# Patient Record
Sex: Female | Born: 1935 | Race: White | Hispanic: No | Smoking: Never smoker
Health system: Southern US, Community
[De-identification: ages and names within clinical notes are randomized; demographics above are authoritative.]

## PROBLEM LIST (undated history)

## (undated) DIAGNOSIS — F0280 Dementia in other diseases classified elsewhere without behavioral disturbance: Secondary | ICD-10-CM

## (undated) DIAGNOSIS — I10 Essential (primary) hypertension: Secondary | ICD-10-CM

## (undated) DIAGNOSIS — S7290XA Unspecified fracture of unspecified femur, initial encounter for closed fracture: Secondary | ICD-10-CM

## (undated) DIAGNOSIS — S0292XA Unspecified fracture of facial bones, initial encounter for closed fracture: Secondary | ICD-10-CM

## (undated) DIAGNOSIS — G309 Alzheimer's disease, unspecified: Secondary | ICD-10-CM

## (undated) DIAGNOSIS — J309 Allergic rhinitis, unspecified: Secondary | ICD-10-CM

## (undated) DIAGNOSIS — M810 Age-related osteoporosis without current pathological fracture: Secondary | ICD-10-CM

---

## 2001-07-21 ENCOUNTER — Emergency Department (HOSPITAL_COMMUNITY): Admission: EM | Admit: 2001-07-21 | Discharge: 2001-07-21 | Payer: Self-pay | Admitting: Emergency Medicine

## 2001-07-21 ENCOUNTER — Encounter: Payer: Self-pay | Admitting: Emergency Medicine

## 2001-07-22 ENCOUNTER — Encounter: Payer: Self-pay | Admitting: General Surgery

## 2001-07-22 ENCOUNTER — Inpatient Hospital Stay (HOSPITAL_COMMUNITY): Admission: EM | Admit: 2001-07-22 | Discharge: 2001-08-08 | Payer: Self-pay | Admitting: General Surgery

## 2008-10-03 ENCOUNTER — Inpatient Hospital Stay (HOSPITAL_COMMUNITY): Admission: EM | Admit: 2008-10-03 | Discharge: 2008-10-05 | Payer: Self-pay | Admitting: Emergency Medicine

## 2010-01-30 ENCOUNTER — Emergency Department (HOSPITAL_COMMUNITY): Admission: EM | Admit: 2010-01-30 | Discharge: 2010-01-31 | Payer: Self-pay | Admitting: Emergency Medicine

## 2010-10-28 ENCOUNTER — Other Ambulatory Visit: Payer: Self-pay | Admitting: Internal Medicine

## 2010-11-05 ENCOUNTER — Ambulatory Visit
Admission: RE | Admit: 2010-11-05 | Discharge: 2010-11-05 | Disposition: A | Discharge status: Home / Self Care | Payer: Medicaid Other | Source: Ambulatory Visit | Attending: Internal Medicine | Admitting: Internal Medicine

## 2010-12-29 LAB — CBC
HCT: 33.6 % — ABNORMAL LOW (ref 36.0–46.0)
Hemoglobin: 9.7 g/dL — ABNORMAL LOW (ref 12.0–15.0)
MCV: 100.2 fL — ABNORMAL HIGH (ref 78.0–100.0)
MCV: 99.9 fL (ref 78.0–100.0)
Platelets: 128 10*3/uL — ABNORMAL LOW (ref 150–400)
RBC: 2.94 MIL/uL — ABNORMAL LOW (ref 3.87–5.11)
RBC: 3.36 MIL/uL — ABNORMAL LOW (ref 3.87–5.11)
RDW: 12.6 % (ref 11.5–15.5)
RDW: 12.9 % (ref 11.5–15.5)
RDW: 15.1 % (ref 11.5–15.5)
WBC: 10.2 10*3/uL (ref 4.0–10.5)

## 2010-12-29 LAB — BASIC METABOLIC PANEL
BUN: 33 mg/dL — ABNORMAL HIGH (ref 6–23)
CO2: 23 mEq/L (ref 19–32)
CO2: 26 mEq/L (ref 19–32)
Calcium: 8.3 mg/dL — ABNORMAL LOW (ref 8.4–10.5)
Calcium: 9.2 mg/dL (ref 8.4–10.5)
Chloride: 104 mEq/L (ref 96–112)
Creatinine, Ser: 0.78 mg/dL (ref 0.4–1.2)
Creatinine, Ser: 0.96 mg/dL (ref 0.4–1.2)
GFR calc Af Amer: >60 mL/min (ref >60)
GFR calc Af Amer: >60 mL/min (ref >60)
Glucose, Bld: 134 mg/dL — ABNORMAL HIGH (ref 70–99)
Potassium: 3.8 mEq/L (ref 3.5–5.1)
Potassium: 4 mEq/L (ref 3.5–5.1)
Sodium: 133 mEq/L — ABNORMAL LOW (ref 135–145)

## 2010-12-29 LAB — URINALYSIS, ROUTINE W REFLEX MICROSCOPIC
Glucose, UA: NEGATIVE mg/dL
Hgb urine dipstick: NEGATIVE
Protein, ur: NEGATIVE mg/dL
Specific Gravity, Urine: 1.029 (ref 1.005–1.030)
Urobilinogen, UA: 0.2 mg/dL (ref 0.0–1.0)
pH: 6 (ref 5.0–8.0)

## 2010-12-29 LAB — DIFFERENTIAL
Basophils Relative: 0 % (ref 0–1)
Monocytes Absolute: 0.2 10*3/uL (ref 0.1–1.0)
Neutrophils Relative %: 87 % — ABNORMAL HIGH (ref 43–77)

## 2010-12-29 LAB — PROTIME-INR
INR: 1.1 (ref 0.00–1.49)
Prothrombin Time: 14.1 seconds (ref 11.6–15.2)
Prothrombin Time: 15.1 seconds (ref 11.6–15.2)
Prothrombin Time: 16.3 seconds — ABNORMAL HIGH (ref 11.6–15.2)

## 2010-12-29 LAB — URINE MICROSCOPIC-ADD ON

## 2010-12-29 LAB — CROSSMATCH: ABO/RH(D): A POS

## 2010-12-29 LAB — POCT CARDIAC MARKERS

## 2010-12-29 LAB — ABO/RH: ABO/RH(D): A POS

## 2011-01-27 NOTE — Discharge Summary (Signed)
Tiffany Mckinney, Tiffany Mckinney                ACCOUNT NO.:  [id number]   MEDICAL RECORD NO.:  [id number]          PATIENT TYPE:  INP   LOCATION:  5015                         FACILITY:  [facility]   PHYSICIAN:  Mark C. Ophelia Charter, M.D.    DATE OF BIRTH:  1922/09/17   DATE OF ADMISSION:  10/03/2008  DATE OF DISCHARGE:  10/05/2008                               DISCHARGE SUMMARY   ADMISSION DIAGNOSES:  1. Left intertrochanteric hip fracture.  2. Hypertension  3. Gastroesophageal reflux disease.  4. Dementia.  5. Gait disorder.  6. Osteoporosis.  7. Anemia.  8. Cataracts.   DISCHARGE DIAGNOSES:  1. Left intertrochanteric hip fracture.  2. Hypertension  3. Gastroesophageal reflux disease.  4. Dementia.  5. Gait disorder.  6. Osteoporosis.  7. Anemia.  8. Cataracts.  9. Posthemorrhagic anemia requiring blood transfusion.  10.Conjunctivitis.   PROCEDURE:  On October 03, 2008, the patient underwent intermedullary  nailing of the left intertrochanteric hip fracture utilizing a Synthesis  HIS nail.  This was performed by Dr. Ophelia Charter under general anesthesia.   CONSULTATIONS:  None.   BRIEF HISTORY:  Tiffany Mckinney is an 77 year old female resident of  [facility] nursing facility.  She was brought to the emergency  room on October 03, 2008 after she fell to the floor injuring her left  hip.  She reports that she does not know why she fell.  She denies loss  of consciousness, head trauma or dizziness.  She was unable to get up as  she could not bear weight on the left lower extremity.  In the emergency  department she was noted to have internal rotation of the left lower  extremity.  Radiographs showed a displaced left intertrochanteric hip  fracture.  It was felt that she would require surgical intervention and  was admitted for the procedure as stated above.   BRIEF HOSPITAL COURSE:  The patient tolerated the procedure under  general anesthesia without complications.  Postoperatively,  neurovascular and motor function of the lower extremities was noted to  be intact.  The patient was placed on Coumadin for DVT prophylaxis and  adjustments in Coumadin dose made according to protimes by the  pharmacist.  The patient had some mild bloody drainage from the incision  initially .  Dressing change was performed.  Incision was dry and clean  on postop day #2.  The patient had elevated temperature postoperatively  and on postop day #2 this was normalized.  She had a Foley catheter  which was discontinued with urinalysis negative for urinary tract  infection.  The patient was able to void without difficulty.  Postoperative hemoglobin and hematocrit dropped to 7.9 and 23.5  respectively.  After 1 unit of packed red blood cells, values returned  to 9.7 and 28.5.  The patient was noted to have purulent matter and  drainage from the eyes.  She was treated with eye drops and warm  compresses for conjunctivitis.  The patient was seen by the physical  therapist.  She was allowed weightbearing as tolerated on the operative  extremity.  The patient was able  to ambulate 5 feet utilizing a walker  with the assistance of the physical therapist.  Occupational Therapy  evaluation was performed.  The patient will require further physical  therapy and occupational therapy for return to her baseline activity  level.  As she was stable on postop day #2, it was felt she could return  to the nursing facility where this would be provided.   PERTINENT LABORATORY VALUES:  On admission:  Chemistry studies with  glucose 129, BUN 33, otherwise values within normal limits.  Repeat BMET  on the day of discharge with sodium of 133, BUN 16, creatinine 0.63,  glucose 142.  On the day of discharge, INR 1.2.   PLAN:  1. The patient will return to [facility].      There she should continue to receive physical therapy for      ambulation and gait training.  She should utilize a walker  for      ambulation and have assistance.  She will be weightbearing as      tolerated on the left lower extremity.  2. For the left hip wound, there are staples present.  These will be      removed at her 2-week office visit.  3. Dressing change can be done daily or as needed.  Ice packs to be      utilized to the left thigh as needed.  She may have range of motion      of the left knee as tolerated.  No hip precautions necessary.  She      should undergo strict skin care to prevent decubitus ulcers.  4.      The patient will remain on Coumadin for deep vein thrombosis      prophylaxis.  4. She should continue on a regular diet.  5. The patient is to follow up with Dr. Ophelia Charter 2 weeks from surgery      with this appointment made by calling [phone].   MEDICATIONS AT DISCHARGE:  1. Tricor 48 mg daily.  2. Os-Cal D p.o. t.i.d.  3. Multivitamin daily.  4. Lisinopril 40 mg daily.  5. Lexapro 10 mg daily.  6. Protonix 40 mg q.12 hours.  7. Namenda 10 mg daily.  8. Metoprolol 50 mg b.i.d.  9. Aricept 10 mg q.h.s.  10.Colace one p.o. b.i.d.  11.Claritin D daily as needed.  12.Zymar 0.3% ophthalmic solution to each eye 3 times daily which was      started on October 04, 2008.  13.Laxative and enema of choice.  14.The patient has been utilizing Percocet 1 to 2 every 4-6 hours as      needed for pain.  15.Robaxin 500 mg one every 8 hours as needed for spasm.  She may      continue these at the nursing facility or different medications      utilized for pain as deemed necessary by the staff physician at the      nursing facility.  Also, her Coumadin should be managed by the      staff at the nursing facility.   If there are questions or concerns regarding her orthopedic status prior  to return office visit, please notify Dr. Ophelia Charter' office by calling the  above stated number.  All questions encouraged and answered at the time  of discharge.  The  patient verbalizes understanding of  instructions.  However, she does  have significant dementia.  She is oriented only to person at the  time  of discharge.   CONDITION ON DISCHARGE:  Stable.      Wende Neighbors, P.A.      Mark C. Ophelia Charter, M.D.  Electronically Signed    SMV/MEDQ  D:  10/05/2008  T:  10/05/2008  Job:  [id number]   cc:   Veverly Fells. Ophelia Charter, M.D.

## 2011-01-27 NOTE — Op Note (Signed)
Tiffany Mckinney, Tiffany Mckinney                ACCOUNT NO.:  [id number]   MEDICAL RECORD NO.:  [id number]          PATIENT TYPE:  INP   LOCATION:  5015                         FACILITY:  [facility]   PHYSICIAN:  Mark C. Ophelia Charter, M.D.    DATE OF BIRTH:  03-29-1923   DATE OF PROCEDURE:  10/03/2008  DATE OF DISCHARGE:                               OPERATIVE REPORT   PREOPERATIVE DIAGNOSIS:  Left three-part intertrochanteric hip fracture.   POSTOPERATIVE DIAGNOSIS:  Left three-part intertrochanteric hip  fracture.   PROCEDURE:  Left intramedullary hip screw Synthes, 100-mm lag screw, 360  length nail.   ESTIMATED BLOOD LOSS:  Minimal.   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  She slipped, lost  her footing, fell, and injured her left hip with intertrochanteric  fracture.  She has had a past history of wrist fracture and cervical  fracture from previous falls.   PROCEDURE:  After induction of general anesthesia, attempts were made to  contact numerous guardianship social workers in clinic, Kandyce Rud,  [employer], and [employer]  These include office numbers and also  pagers all were unsuccessful.  She was taken from the emergency room to  the OR with broken hip stabilization and this ambulator was appropriate  and Dr. Beverely Low, another board certified orthopedic surgeon in  another group, also signed the consent and a brief note in the progress  note stating that the surgery was necessary and indicated for  stabilization of her unstable hip fracture.   After induction of general anesthesia, the patient was placed in the  fracture table, well-leg holder.  Foley catheter had been inserted,  urine was clear, foot was carefully padded, secured, traction applied,  internal rotation and C-arm used for checking reduction.  There was  excellent positional alignment, AP and lateral.  Hip was prepped from  the  iliac crest down past the knee.  Area was squared with towels and a  large Betadine Vi-Drape was applied that was the shower, curtain size.  After surgical time-out was completed, Ancef was given prophylactically,  incision was made laterally above the trochanter.  Gluteus medius fascia  was split.  Using fluoroscopic visualization, the guidewire was drilled  down below the lesser trochanteric region, overreamed and then need a  selected rod based on preoperative fluoroscopic measurements showing a  360 length nail was appropriate.  Nail was inserted and packed in an  appropriate position, guide attached, small incision was made in the  subtrochanteric region.  Pin was drilled up, initially was a little bit  high, it was backed out.  The nail was inserted a little bit further and  then as this pin was reamed up, it was centered on AP and centered on  lateral.  Overreaming of the cortex followed by insertion of the lag  screw, locking it down proximally and then removing the aiming device.  After irrigation of saline solution, mild fluoroscopy pictures were  taken confirming excellent position; one picture taken showing the  rod  at the knee.  There was good tight fit down through the canal and no  distal interlock was necessary.  After irrigation, the fascia closed  with 0 Vicryl, 2-0 Vicryl, subcutaneous tissue, skin staple closure,  Marcaine infiltration, postop dressing, and then extubation, transferred  to recovery room in stable condition.  Needle count was correct.  In the  recovery room  PACU, I am again attempting to contact [facility]  Social Services now the after-hours number.  I have reached someone who  supposed to contact a guardianship person, so I can notify them that the  patient has underwent the appropriate surgery, is stable, and is doing  well.      Mark C. Ophelia Charter, M.D.  Electronically Signed     MCY/MEDQ  D:  10/03/2008  T:  10/04/2008  Job:   [id number]   cc:   Almedia Balls. Ranell Patrick, M.D.

## 2011-01-30 NOTE — H&P (Signed)
[place]. [facility]  Patient:    Tiffany Mckinney, Tiffany Mckinney Visit Number: [id number] MRN: [id number]          Service Type: EMS Location: ED Attending Physician:  Devoria Albe Dictated by:   Netta Cedars, M.D. Admit Date:  07/21/2001 Discharge Date: 07/21/2001                           History and Physical  ADDENDUM TO DICTATION [phone]  DIAGNOSES: Axis I:    Mild dementia, rule out delusional disorder, paranoid type. 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 Axes III:  1. Hypertension.            2. Chronic obstructive pulmonary disease. Axis IV:   Moderate-to-severe stressor, visible injury. Axis V:    Global assessment of functioning at present 50; maximal for the            past year 80.  DISCUSSION AND RECOMMENDATION:  Patient, in examination, had some memory problems, especially retention and recall were one of three objects with some help; otherwise, however, she was alert, oriented x 3, knows of her whereabouts, can calculate her age, prices of some things, etc.  If there is some element of dementia, it is insignificant; patient, however, has some definite paranoid traits and she believes up to a delusional level, ______ were entering her apartment and stealing things from her.  She was pretty guarded and defensive but overall she cooperated with the examiner.  I asked her whether she was being ______ self-medication which could cause her sleep and she agreed if okay with attending.  We started her on 0.25 mg of Risperdal at bedtime.  If any problems, the unit should call Dr. Adelene Amas. [place] for followup.  Patient responded well on hypothetical questions related to safety in her apartment.  When asked for example with a fire, she thought that she had to quickly leave the apartment, then call 911.  It seems like she has some good social judgment, albeit, I would recommend more supervised housing situation.  At this point, however, I do  not see that there are any good ends to make patient go over there on different than voluntary status.  I would like to check thyroid panel, folic acid levels and blood count.  If okay with the attending, will start the patient on Risperdal 0.25 mg one at bedtime to help with sleep and to decrease delusional feelings. Dictated by:   Netta Cedars, M.D. Attending Physician:  Devoria Albe DD:  07/24/01 TD:  07/25/01 Job: [id number]

## 2011-01-30 NOTE — Discharge Summary (Signed)
[place]. [facility]  Patient:    Tiffany Mckinney, HELDT Visit Number: [id number] MRN: [id number]          Service Type: EMS Location: ED Attending Physician:  Devoria Albe Dictated by:   Eugenia Pancoast, P.A. Admit Date:  07/21/2001 Discharge Date: 07/21/2001                             Discharge Summary  DATE OF BIRTH:  1923-07-02  ADMITTING PHYSICIAN:  Currie Paris, M.D.  CONSULTING PHYSICIAN:  Netta Cedars, M.D.  FINAL DIAGNOSES: 1. Fall with multiple fractures. 2. Cervical spine fracture, distal radial fracture, left zygoma fracture. 3. Dementia. 4. Elevated cholesterol. 5. Hypertension.  HISTORY OF PRESENT ILLNESS:  This is a 77 year old female who fell down multiple stairs.  She was seen in the emergency room and was trauma consulted due to her mechanism of injury.  Subsequently, she was seen by Dr. Jamey Ripa.  He reviewed the x-rays and she was subsequently admitted.  HOSPITAL COURSE:  Patient was admitted and noted to have confusion.  She refused to wear the C collar.  She took this off.  She refused to wear any other type of braces.  Dr. Warren Danes was called for consult of the facial fractures.  He saw the patient and noted she would not need any surgical intervention at this time.  She was also consulted by Dr. Lourdes Sledge because of her mental status changes.  He saw the patient.  He noted she had a personality disorder, she had mild dementia.  Because of this, patient could not be discharged.  Patient lives alone.  There was a question of whether or not who had legal rights over this patient at this time.  Patient was noted to be more alert in the morning and, as the day progresses, she becomes more and more demented.  Her fractures were healing satisfactorily.  She had no neurological deficits secondary to not wearing the collar.  She continued doing satisfactorily after that point.  She was noted to need a place to live such as  in assisted living.  Subsequently, care management took care of finding the patient a place and [facility] agreed to take her and, apparently, patient has agreed to go.  Patient is subsequently prepared at this time to be discharged.  Her fractures are healing satisfactorily.  She has a C2 fracture with no neurological signs.  She has a left wrist fracture which apparently does not bother her.  She has a left rib fracture which is not causing her any difficulties.  She does have hypertension.  She is on lisinopril which has been increased.  She is started on a multivitamin.  At the time of discharge, she will take Pepcid 20 mg p.o. q.12h., Neosporin ointment topically q.d., Lopressor 50 mg b.i.d., Risperdal 0.25 mg q.h.s., Percocet 5/325 one or two tablets q.d., Lisinopril 20 mg q.d., multivitamin with iron p.r.n., Ativan 0.5 mg p.o. b.i.d. p.r.n.  Patient is subsequently transferred to [place] today in satisfactory, stable condition. Dictated by:   Eugenia Pancoast, P.A. Attending Physician:  Devoria Albe DD:  08/08/01 TD:  08/08/01 Job: [id number]

## 2011-01-30 NOTE — H&P (Signed)
[facility]  Patient:    Tiffany Mckinney, Tiffany Mckinney Visit Number: [id number] MRN: [id number]          Service Type: EMS Location: ED Attending Physician:  Devoria Albe Dictated by:   Currie Paris, M.D. Admit Date:  07/21/2001 Discharge Date: 07/21/2001                           History and Physical  VISIT:  [id number]  HISTORY OF PRESENT ILLNESS:  Tiffany Mckinney fell down about 8-10 stairs sustaining multiple injuries, and I was asked by the EDP to see this patient. The patient complains of head pain, neck is sore, left ribs are sore, and her left arm.  At the time I saw her her left arm was in a splint.  She arrived here at approximately 1520.  I was called about her approximately 1920 when I was in surgery and saw her approximately 2015.  PAST MEDICAL HISTORY: 1. High blood pressure, but is out of her medicines and has not been taking    any. 2. High cholesterol. 3. Multiple surgical procedures and is not clear on what all she had done.  ALLERGIES:  No known drug allergies.  PHYSICAL EXAMINATION:  VITAL SIGNS:  Hypertension in the emergency room with systolics around 200 and diastolics around 70.  Pulse stable around 70.  GENERAL:  Alert, awake, and appears somewhat uncomfortable.  HEENT:  Ecchymosis, bruising around left orbit.  Pupils are reactive.  She is tender around the left zygoma.  NECK:  Neck also tender posteriorly.  She is in a collar.  She is tender along left lateral rib cage.  LUNGS:  Sounds clear.  HEART:  Regular rate and rhythm without murmur, rub or gallop.  ABDOMEN:  Seems benign.  EXTREMITIES:  She does have her left arm in a sling.  LABORATORY:  No studies have been done as of now.  X-RAYS:  Reviewed and she has multiple fractures.  She has apparently a C facet. Looks like sixth and seventh posterior ribs, but no pneumothorax.   She has distal radial fracture.  Left zygoma fracture is noted as well.  IMPRESSION:   Fall with multiple fractures.  PLAN:  The patient will need hospitalization I believe.  There are no current "general surgery" indications since I do not believe she has either lung or abdominal symptoms or problems currently, although splenic injury has not been ruled out completely.  She does not seem tender.  I have spoken with Adolph Pollack, M.D. who is on call for trauma at [facility].  Both the ICU at [facility] and the ICU at [facility] as well as the regular beds are all completely full and patients are in the emergency room holding.  We both believe she will need some evaluation monitoring, more than can be done simply in an emergency room room, and I have spoken with Dr. Storm Frisk, the EDP, who will try to make arrangements to transfer this patient for [facility] for further care.  Lacking that, she will have to be monitored in the emergency department here until a bed is available at [facility]. Dictated by:   Currie Paris, M.D. Attending Physician:  Devoria Albe DD:  07/21/01 TD:  07/22/01 Job: [id number]

## 2011-01-30 NOTE — Consult Note (Signed)
[place]. [facility]  Patient:    Tiffany Mckinney, Tiffany Mckinney Visit Number: [id number] MRN: [id number]          Service Type: EMS Location: ED Attending Physician:  Devoria Albe Dictated by:   Netta Cedars, M.D. Proc. Date: 07/24/01 Admit Date:  07/21/2001 Discharge Date: 07/21/2001                            Consultation Report  INCOMPLETE  REQUESTING PHYSICIAN:  Edwin Cap. Zoila Shutter, M.D.  REASON FOR CONSULTATION:  "Psychosis versus dementia."  INTRODUCTION:  [age]-year-old white divorced female who lives alone. 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 The patient was hospitalized at [facility] since July 21, 2001, after a serious fall 12 steps down from the stairs at her home.  She sustained numerous fractures of cervical and lumbar spine, rib fracture, distal radius stress fracture. etc.  Consultation was called to assess the patients mental status in view of her not fully cooperating with the treating staff.  PAST MEDICAL HISTORY:  In addition to her recent trauma, the patient suffers from hypertension, COPD, and elevation of cholesterol.  The patient does not have any previous psychiatric history.  According to the records, she was described by her neighbor as being violent and often a suspicious person.  The patient confirms as well to me that she has some memory losses but plays them down telling me that they are probably age related.  Overall, she seems to be pretty okay during the interview.  Medically she suffers, in addition to recent fall, from hypertension and irritable bowel symptoms.  CBC, CMET were normal.  MENTAL STATUS EXAMINATION:  Mental status on admission revealed a pleasant, elderly white female with bruises and hematomas on her face.  The patient is going to forego neck surgery on Monday.  The patients picture of motor activity was normal.  Thoughts were well organized and goal  directed. She denies hallucinations, there are no delusions.  The patient, however, had feelings that some strange people are there in her apartment stealing things from her and leaving a notes saying they are behind her.  She often times has changed apartment locks but did not help.  She does not seem to be distressed by this event and told me "things happen to good people sometimes."  The patient denied being depressed or any other disturbance like ideas of reference or delusions.  No suicidal or homicidal ideation.  Oriented x 3 with pretty good memory and normal intelligence.  Insight and judgment are relatively poor.  The patient does not see anything wrong with referring to her apartment, even, she cannot well explain the character of her flow. There were no abnormal movements or other abnormality in speech noted, no tremors.  The patient sounded very sincere and with the exception of the to ___ _______ she presented with, she was reliable.  LABORATORY AND ACCESSORY DATA:  Normal CBC, chemistry-17, urinalysis normal.  Slight atrophy on CT scan of the brain.  SUBSTANCE ABUSE:  The patient denies drinking or doing drugs.  FAMILY HISTORY:  Negative for mental problems.  SOCIAL HISTORY:  The patient is divorced for 25 years and for several years lives alone.  She seems to be well adjusted to her situation.  DIAGNOSTIC IMPRESSION: Axis I:    1. Personality disorder, rule out.  2. Mild dementia. 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 Axis III:  Hypertension. Axis IV: Axis V: Dictated by:   Netta Cedars, M.D. Attending Physician:  Devoria Albe DD:  07/24/01 TD:  07/25/01 Job: [id number]

## 2012-12-05 ENCOUNTER — Non-Acute Institutional Stay (SKILLED_NURSING_FACILITY): Payer: Medicare Other | Admitting: Internal Medicine

## 2012-12-05 DIAGNOSIS — E781 Pure hyperglyceridemia: Secondary | ICD-10-CM

## 2012-12-08 NOTE — Progress Notes (Signed)
PROGRESS NOTE  DATE: 12/05/12  FACILITY: [facility]  LEVEL OF CARE: SNF  Acute Visit  CHIEF COMPLAINT:  Manage hypertriglyceridemia  HISTORY OF PRESENT ILLNESS: I was requested by the staff to assess the patient regarding above problems: Recent lipid panel shows total cholesterol 260, triglycerides 356, LDL 155. Patient does not follow commands due to advanced dementia. Lipid panel was checked due to her being on antipsychotics.  PAST MEDICAL HISTORY : Reviewed.  No changes.  CURRENT MEDICATIONS: Reviewed per [facility]  PHYSICAL EXAMINATION  GENERAL: no acute distress, normal body habitus CARDIAC: RRR, no murmur,no extra heart sounds, no edema GI: abdomen soft, normal BS, no masses, no tenderness, no hepatomegaly, no splenomegaly  LABS/RADIOLOGY: See history of present illness  ASSESSMENT/PLAN:  Hypertriglyceridemia-new problem. Start lovaza 2 g twice a day.  DC lipid panel checks due to advanced age and dementia.  CPT CODE: 16109

## 2012-12-23 ENCOUNTER — Non-Acute Institutional Stay (SKILLED_NURSING_FACILITY): Payer: Medicare Other | Admitting: Internal Medicine

## 2012-12-23 DIAGNOSIS — F0280 Dementia in other diseases classified elsewhere without behavioral disturbance: Secondary | ICD-10-CM

## 2012-12-23 DIAGNOSIS — M810 Age-related osteoporosis without current pathological fracture: Secondary | ICD-10-CM

## 2012-12-23 DIAGNOSIS — J309 Allergic rhinitis, unspecified: Secondary | ICD-10-CM

## 2012-12-23 DIAGNOSIS — I10 Essential (primary) hypertension: Secondary | ICD-10-CM

## 2013-01-11 NOTE — Progress Notes (Signed)
Patient ID: Tiffany Mckinney, female   DOB: 1923/06/01, 79 y.o.   MRN: [id number]        PROGRESS NOTE  DATE:  12/23/2012  FACILITY: [facility]   LEVEL OF CARE: SNF  Routine Visit  CHIEF COMPLAINT:  Manage Alzheimer's dementia and hypertension.   HISTORY OF PRESENT ILLNESS:  REASSESSMENT OF ONGOING PROBLEM(S):  DEMENTIA: The dementia remains stable and continues to function adequately in the current living environment with supervision.  The patient has had little changes in behavior. No complications noted from the medications presently being used.  The dementia is advanced and the patient does not follow commands.    HTN: Pt 's HTN remains stable.  Staff denies CP, sob, DOE, pedal edema, headaches, dizziness or visual disturbances.  No complications from the medications currently being used.  Last BP :  122/68, 130/66, 116/56.  PAST MEDICAL HISTORY : Reviewed.  No changes.  CURRENT MEDICATIONS: Reviewed per [facility]  REVIEW OF SYSTEMS:  Unobtainable due to advanced dementia.  PHYSICAL EXAMINATION  VS:  T 96.8      P 66      RR 20     BP 122/68    POX %     WT (Lb) 154  GENERAL: no acute distress, normal body habitus NECK: supple, trachea midline, no neck masses, no thyroid tenderness, no thyromegaly RESPIRATORY: breathing is even & unlabored, BS CTAB CARDIAC: RRR, no murmur,no extra heart sounds, no edema GI: abdomen soft, normal BS, no masses, no tenderness, no hepatomegaly, no splenomegaly PSYCHIATRIC: the patient is alert & oriented to person, affect & behavior appropriate  LABS/RADIOLOGY: 11/2012:  CMP normal.   08/2012:  CBC normal.   ASSESSMENT/PLAN:  Alzheimer's dementia.  Advanced.   Hypertension.  Well controlled.   Allergic rhinitis.  Continue Claritin.   Osteoporosis.  Continue Fosamax, calcium and vitamin D.   Depression.  Continue  Lexapro.   Allergic conjunctivitis.  Continue Patanol.    Constipation.  Well controlled.    CPT CODE: 40981

## 2013-01-18 ENCOUNTER — Non-Acute Institutional Stay (SKILLED_NURSING_FACILITY): Payer: Medicare Other | Admitting: Internal Medicine

## 2013-01-18 DIAGNOSIS — I10 Essential (primary) hypertension: Secondary | ICD-10-CM

## 2013-01-18 DIAGNOSIS — G309 Alzheimer's disease, unspecified: Secondary | ICD-10-CM

## 2013-01-18 DIAGNOSIS — F0280 Dementia in other diseases classified elsewhere without behavioral disturbance: Secondary | ICD-10-CM

## 2013-01-18 DIAGNOSIS — J309 Allergic rhinitis, unspecified: Secondary | ICD-10-CM

## 2013-01-18 DIAGNOSIS — M810 Age-related osteoporosis without current pathological fracture: Secondary | ICD-10-CM

## 2013-01-20 DIAGNOSIS — J309 Allergic rhinitis, unspecified: Secondary | ICD-10-CM

## 2013-01-20 DIAGNOSIS — F0280 Dementia in other diseases classified elsewhere without behavioral disturbance: Secondary | ICD-10-CM

## 2013-01-20 DIAGNOSIS — G309 Alzheimer's disease, unspecified: Secondary | ICD-10-CM | POA: Insufficient documentation

## 2013-01-20 DIAGNOSIS — M810 Age-related osteoporosis without current pathological fracture: Secondary | ICD-10-CM | POA: Insufficient documentation

## 2013-01-20 DIAGNOSIS — I10 Essential (primary) hypertension: Secondary | ICD-10-CM | POA: Insufficient documentation

## 2013-01-20 HISTORY — DX: Age-related osteoporosis without current pathological fracture: M81.0

## 2013-01-20 HISTORY — DX: Allergic rhinitis, unspecified: J30.9

## 2013-01-20 HISTORY — DX: Dementia in other diseases classified elsewhere, unspecified severity, without behavioral disturbance, psychotic disturbance, mood disturbance, and anxiety: F02.80

## 2013-01-20 HISTORY — DX: Essential (primary) hypertension: I10

## 2013-01-20 NOTE — Progress Notes (Signed)
Patient ID: Tiffany Mckinney, female   DOB: March 04, 1923, 79 y.o.   MRN: [id number]        PROGRESS NOTE  DATE:  01/18/2013  FACILITY: [facility]   LEVEL OF CARE: SNF  Routine Visit  CHIEF COMPLAINT:  Manage Alzheimer's dementia and hypertension.   HISTORY OF PRESENT ILLNESS:  REASSESSMENT OF ONGOING PROBLEM(S):  DEMENTIA: The dementia remains stable and continues to function adequately in the current living environment with supervision.  The patient has had little changes in behavior. No complications noted from the medications presently being used.  The dementia is advanced and the patient does not follow commands.    HTN: Pt 's HTN remains stable.  Staff denies CP, sob, DOE, pedal edema, headaches, dizziness or visual disturbances.  No complications from the medications currently being used.  Last BP :  138/68, 122/68, 130/66, 116/56.  PAST MEDICAL HISTORY : Reviewed.  No changes.  CURRENT MEDICATIONS: Reviewed per [facility]  REVIEW OF SYSTEMS:  Unobtainable due to advanced dementia.  PHYSICAL EXAMINATION  VS:  T 96     P 68      RR 22     BP 138/68    POX %     WT (Lb) 153  GENERAL: no acute distress, normal body habitus NECK: supple, trachea midline, no neck masses, no thyroid tenderness, no thyromegaly RESPIRATORY: breathing is even & unlabored, BS CTAB CARDIAC: RRR, no murmur,no extra heart sounds, no edema GI: abdomen soft, normal BS, no masses, no tenderness, no hepatomegaly, no splenomegaly PSYCHIATRIC: the patient is alert & oriented to person, affect & behavior appropriate  LABS/RADIOLOGY: 11/2012:  CMP normal.   08/2012:  CBC normal.   ASSESSMENT/PLAN:  Alzheimer's dementia.  Advanced.   Hypertension.  Well controlled.   Allergic rhinitis.  Continue Claritin.   Osteoporosis.  Continue Fosamax, calcium and vitamin D.   Depression.  Continue  Lexapro.   Allergic conjunctivitis.  Continue Patanol.    Constipation.  Well controlled.    CPT CODE: 40981

## 2013-01-30 ENCOUNTER — Non-Acute Institutional Stay (SKILLED_NURSING_FACILITY): Payer: Medicare Other | Admitting: Internal Medicine

## 2013-01-30 DIAGNOSIS — R634 Abnormal weight loss: Secondary | ICD-10-CM

## 2013-02-15 ENCOUNTER — Non-Acute Institutional Stay (SKILLED_NURSING_FACILITY): Payer: Medicare Other | Admitting: Internal Medicine

## 2013-02-15 DIAGNOSIS — F0280 Dementia in other diseases classified elsewhere without behavioral disturbance: Secondary | ICD-10-CM

## 2013-02-15 DIAGNOSIS — M810 Age-related osteoporosis without current pathological fracture: Secondary | ICD-10-CM

## 2013-02-15 DIAGNOSIS — J309 Allergic rhinitis, unspecified: Secondary | ICD-10-CM

## 2013-02-15 DIAGNOSIS — I10 Essential (primary) hypertension: Secondary | ICD-10-CM

## 2013-02-15 DIAGNOSIS — G309 Alzheimer's disease, unspecified: Secondary | ICD-10-CM

## 2013-02-15 NOTE — Progress Notes (Signed)
Patient ID: Tiffany Mckinney, female   DOB: 09-30-1922, 79 y.o.   MRN: [id number]        PROGRESS NOTE  DATE:  02/15/2013  [facility]AC[facility]: [facility]   LEVEL OF CARE: SNF  Routine Visit  CHIEF COMPLAINT:  Manage Alzheimer's dementia and hypertension.   HISTORY OF PRESENT ILLNESS:  REASSESSMENT OF ONGOING PROBLEM(S):  DEMENTIA: The dementia remains stable and continues to function adequately in the current living environment with supervision.  The patient has had little changes in behavior. No complications noted from the medications presently being used.  The dementia is advanced and the patient does not follow commands.    HTN: Pt 's HTN remains stable.  Staff deny CP, sob, DOE, pedal edema, headaches, dizziness or visual disturbances.  No complications from the medications currently being used.  Last BP : 130/60, 138/68, 122/68, 130/66, 116/56.  PAST MEDICAL HISTORY : Reviewed.  No changes.  CURRENT MEDICATIONS: Reviewed per [facility]  REVIEW OF SYSTEMS:  Unobtainable due to advanced dementia.  PHYSICAL EXAMINATION  VS:  T 96     P 78      RR 22     BP 138/68    POX %     WT (Lb) 151  GENERAL: no acute distress, normal body habitus NECK: supple, trachea midline, no neck masses, no thyroid tenderness, no thyromegaly RESPIRATORY: breathing is even & unlabored, BS CTAB CARDIAC: RRR, no murmur,no extra heart sounds, no edema GI: abdomen soft, normal BS, no masses, no tenderness, no hepatomegaly, no splenomegaly PSYCHIATRIC: the patient is alert & oriented to person, affect & behavior appropriate  LABS/RADIOLOGY: 5/14 TSH 1.97 11/2012:  CMP normal.   08/2012:  CBC normal.   ASSESSMENT/PLAN:  Alzheimer's dementia.  Advanced.   Hypertension.  Well controlled.   Allergic rhinitis.  Continue Claritin.   Osteoporosis.  Continue Fosamax, calcium and vitamin D.   Depression.  Continue  Lexapro.   Allergic conjunctivitis.  Continue Patanol.    Constipation.  Well  controlled.  Check CBC.   CPT CODE: 40981

## 2013-02-20 DIAGNOSIS — R634 Abnormal weight loss: Secondary | ICD-10-CM | POA: Insufficient documentation

## 2013-02-20 NOTE — Progress Notes (Signed)
Patient ID: Tiffany Mckinney, female   DOB: 09-26-1922, 79 y.o.   MRN: [id number]        PROGRESS NOTE  DATE: 01/30/2013  FACILITY:  [facility]  LEVEL OF CARE: SNF (31)  Acute Visit  CHIEF COMPLAINT:  Manage failure to thrive.    HISTORY OF PRESENT ILLNESS: I was requested by the staff to assess the patient regarding above problem(s):  FTT: I was requested by the facility to assess the patient for possible failure to thrive.  Due to advanced dementia, patient does not follow commands.  Last weight: Patient's last recorded weight was on 12/29/2012 and it was 153.  In March, weight was 154.  In February, It was 155.  In January and December, it was 156.  In November 2013, it was 160.  In October 2013, weight was 162.  PAST MEDICAL HISTORY : Reviewed.  No changes.  CURRENT MEDICATIONS: Reviewed per [facility]  REVIEW OF SYSTEMS:  Unobtainable due to dementia.    PHYSICAL EXAMINATION  GENERAL: no acute distress, normal body habitus NECK: supple, trachea midline, no neck masses, no thyroid tenderness, no thyromegaly RESPIRATORY: breathing is even & unlabored, BS CTAB CARDIAC: RRR, no murmur,no extra heart sounds, no edema GI: abdomen soft, normal BS, no masses, no tenderness, no hepatomegaly, no splenomegaly PSYCHIATRIC: the patient is minimally alert, decreased affect and mood   LABS/RADIOLOGY: 11/2012:  CMP normal.    ASSESSMENT/PLAN:  Weight loss 783.21.  There is a slow, gradual weight loss, but I do not have a weight for one month.  Therefore, we will request recent weights.  Check TSH.  Currently, I am not able to provide the diagnosis of failure to thrive with available data.  CPT CODE: 40981

## 2013-02-22 ENCOUNTER — Non-Acute Institutional Stay (SKILLED_NURSING_FACILITY): Payer: Medicare Other | Admitting: Internal Medicine

## 2013-02-22 DIAGNOSIS — N179 Acute kidney failure, unspecified: Secondary | ICD-10-CM

## 2013-03-01 ENCOUNTER — Non-Acute Institutional Stay (SKILLED_NURSING_FACILITY): Payer: Medicare Other | Admitting: Internal Medicine

## 2013-03-01 DIAGNOSIS — N179 Acute kidney failure, unspecified: Secondary | ICD-10-CM

## 2013-03-01 DIAGNOSIS — I10 Essential (primary) hypertension: Secondary | ICD-10-CM

## 2013-03-08 ENCOUNTER — Non-Acute Institutional Stay (SKILLED_NURSING_FACILITY): Payer: Medicare Other | Admitting: Internal Medicine

## 2013-03-08 DIAGNOSIS — N179 Acute kidney failure, unspecified: Secondary | ICD-10-CM

## 2013-03-13 NOTE — Progress Notes (Signed)
Patient ID: Tiffany Mckinney, female   DOB: 28-Dec-1922, 79 y.o.   MRN: [id number]        PROGRESS NOTE  DATE: 02/22/2013  FACILITY:  [facility]  LEVEL OF CARE: SNF (31)  Acute Visit  CHIEF COMPLAINT:  Manage acute renal failure.    HISTORY OF PRESENT ILLNESS: I was requested by the staff to assess the patient regarding above problem(s):  ACUTE RENAL FAILURE:  On 02/16/2013, patient's BUN was 20, creatinine 1.11.  In 11/2012, BUN 18, creatinine 0.99.  Patient is a poor historian due to dementia.  Staff do not report increasing confusion or lower extremity swelling.    PAST MEDICAL HISTORY : Reviewed.  No changes.  CURRENT MEDICATIONS: Reviewed per [facility]  REVIEW OF SYSTEMS:  Unobtainable due to dementia.   PHYSICAL EXAMINATION  GENERAL: no acute distress, normal body habitus NECK: supple, trachea midline, no neck masses, no thyroid tenderness, no thyromegaly RESPIRATORY: breathing is even & unlabored, BS CTAB CARDIAC: RRR, no murmur,no extra heart sounds, no edema GI: abdomen soft, normal BS, no masses, no tenderness, no hepatomegaly, no splenomegaly PSYCHIATRIC: the patient is alert, disoriented, affect & behavior appropriate  ASSESSMENT/PLAN:  Acute renal failure.  New problem.  Patient is on lisinopril.  Therefore, we will reassess renal functions.  If trending up, we will need to discontinue lisinopril.    CPT CODE: 40981

## 2013-03-22 DIAGNOSIS — N179 Acute kidney failure, unspecified: Secondary | ICD-10-CM | POA: Insufficient documentation

## 2013-03-22 NOTE — Progress Notes (Signed)
Patient ID: Tiffany Mckinney, female   DOB: October 29, 1922, 79 y.o.   MRN: [id number]        PROGRESS NOTE  DATE: 03/01/2013  FACILITY:  [facility]  LEVEL OF CARE: SNF (31)  Acute Visit  CHIEF COMPLAINT:  Manage acute renal failure.    HISTORY OF PRESENT ILLNESS: I was requested by the staff to assess the patient regarding above problem(s):  On 02/28/2013:  BUN 30, creatinine 1.21.  On 02/16/2013:  BUN 20, creatinine 1.11.  Patient is a poor historian due to advanced dementia.  Staff do not report increasing confusion or lower extremity swelling.  She is  currently on lisinopril.    PAST MEDICAL HISTORY : Reviewed.  No changes.  CURRENT MEDICATIONS: Reviewed per [facility]  REVIEW OF SYSTEMS:  Unobtainable due to dementia.    PHYSICAL EXAMINATION  VS:  T       P 78      RR      BP 130/60     POX %       WT (Lb)  GENERAL: no acute distress, normal body habitus NECK: supple, trachea midline, no neck masses, no thyroid tenderness, no thyromegaly RESPIRATORY: breathing is even & unlabored, BS CTAB CARDIAC: RRR, no murmur,no extra heart sounds, no edema GI: abdomen soft, normal BS, no masses, no tenderness, no hepatomegaly, no splenomegaly PSYCHIATRIC: the patient is alert, disoriented, affect & behavior appropriate  ASSESSMENT/PLAN:  Acute renal failure.  Worsening problem.  Discontinue lisinopril.  Reassess on 03/06/2013.    Hypertension.  Since I am discontinuing lisinopril, increase Norvasc to 10 mg q.d.    CPT CODE: 03474

## 2013-03-29 ENCOUNTER — Non-Acute Institutional Stay (SKILLED_NURSING_FACILITY): Payer: Medicare Other | Admitting: Internal Medicine

## 2013-03-29 DIAGNOSIS — I10 Essential (primary) hypertension: Secondary | ICD-10-CM

## 2013-03-29 DIAGNOSIS — J309 Allergic rhinitis, unspecified: Secondary | ICD-10-CM

## 2013-03-29 DIAGNOSIS — M810 Age-related osteoporosis without current pathological fracture: Secondary | ICD-10-CM

## 2013-03-29 DIAGNOSIS — G309 Alzheimer's disease, unspecified: Secondary | ICD-10-CM

## 2013-03-29 DIAGNOSIS — F0280 Dementia in other diseases classified elsewhere without behavioral disturbance: Secondary | ICD-10-CM

## 2013-03-29 NOTE — Progress Notes (Signed)
Patient ID: Tiffany Mckinney, female   DOB: [birth year], 79 y.o.   MRN: [id number]        PROGRESS NOTE  DATE:  03/29/2013  [facility]   LEVEL OF CARE: SNF  Routine Visit  CHIEF COMPLAINT:  Manage Alzheimer's dementia and hypertension.   HISTORY OF PRESENT ILLNESS:  REASSESSMENT OF ONGOING PROBLEM(S):  DEMENTIA: The dementia remains stable and continues to function adequately in the current living environment with supervision.  The patient has had little changes in behavior. No complications noted from the medications presently being used.  The dementia is advanced and the patient does not follow commands.    HTN: Pt 's HTN remains stable.  Staff deny CP, sob, DOE, pedal edema, headaches, dizziness or visual disturbances.  No complications from the medications currently being used.  Last BP : 130/60, 138/68, 122/68, 130/66, 116/56.  PAST MEDICAL HISTORY : Reviewed.  No changes.  CURRENT MEDICATIONS: Reviewed per [facility]  REVIEW OF SYSTEMS:  Unobtainable due to advanced dementia.  PHYSICAL EXAMINATION  VS:  T 96     P 78      RR 22     BP 138/68    POX %     WT (Lb) 151  GENERAL: no acute distress, normal body habitus NECK: supple, trachea midline, no neck masses, no thyroid tenderness, no thyromegaly RESPIRATORY: breathing is even & unlabored, BS CTAB CARDIAC: RRR, no murmur,no extra heart sounds, no edema GI: abdomen soft, normal BS, no masses, no tenderness, no hepatomegaly, no splenomegaly PSYCHIATRIC: the patient is alert & oriented to person, affect & behavior appropriate  LABS/RADIOLOGY: 6/14 BUN 20, creatinine 1.42, CBC normal 5/14 TSH 1.97 11/2012:  CMP normal.   08/2012:  CBC normal.   ASSESSMENT/PLAN:  Alzheimer's dementia.  Advanced.   Hypertension. lisinopril DC'd and Norvasc increased.  Allergic rhinitis.  Continue Claritin.   Osteoporosis.  Continue Fosamax, calcium and vitamin D.   Depression.  Continue  Lexapro.   Allergic  conjunctivitis.  Continue Patanol.    Constipation.  Well controlled.  Acute renal failure-improved after discontinuation of lisinopril.  CPT CODE: 78295

## 2013-04-05 NOTE — Progress Notes (Signed)
Patient ID: Tiffany Mckinney, female   DOB: [birth year], 79 y.o.   MRN: [id number]        PROGRESS NOTE  DATE: 03/08/2013  FACILITY:  [facility]  LEVEL OF CARE: SNF (31)  Acute Visit  CHIEF COMPLAINT:  Manage acute renal failure.    HISTORY OF PRESENT ILLNESS: I was requested by the staff to assess the patient regarding above problem(s):  On 03/06/2013, patient's BUN was 20, creatinine 1.02.  On 02/28/2013, creatinine was 1.21.  Patient is a poor historian due to dementia.  Her lisinopril was discontinued on 03/01/2013.    PAST MEDICAL HISTORY : Reviewed.  No changes.  CURRENT MEDICATIONS: Reviewed per [facility]  PHYSICAL EXAMINATION  GENERAL: no acute distress, normal body habitus RESPIRATORY: breathing is even & unlabored, BS CTAB CARDIAC: RRR, no murmur,no extra heart sounds, no edema  ASSESSMENT/PLAN:  Acute renal failure.  Much improved after discontinuation of lisinopril.    CPT CODE: 40981

## 2013-04-27 ENCOUNTER — Non-Acute Institutional Stay (SKILLED_NURSING_FACILITY): Payer: Medicare Other | Admitting: Internal Medicine

## 2013-04-27 DIAGNOSIS — M810 Age-related osteoporosis without current pathological fracture: Secondary | ICD-10-CM

## 2013-04-27 DIAGNOSIS — G309 Alzheimer's disease, unspecified: Secondary | ICD-10-CM

## 2013-04-27 DIAGNOSIS — F0280 Dementia in other diseases classified elsewhere without behavioral disturbance: Secondary | ICD-10-CM

## 2013-04-27 DIAGNOSIS — J309 Allergic rhinitis, unspecified: Secondary | ICD-10-CM

## 2013-04-27 DIAGNOSIS — I10 Essential (primary) hypertension: Secondary | ICD-10-CM

## 2013-04-29 NOTE — Progress Notes (Signed)
Patient ID: Tiffany Mckinney, female   DOB: September 20, 1922, 79 y.o.   MRN: [id number]        PROGRESS NOTE  DATE:  04/27/2013  [facility]   LEVEL OF CARE: SNF  Routine Visit  CHIEF COMPLAINT:  Manage Alzheimer's dementia and hypertension.   HISTORY OF PRESENT ILLNESS:  REASSESSMENT OF ONGOING PROBLEM(S):  DEMENTIA: The dementia remains stable and continues to function adequately in the current living environment with supervision.  The patient has had little changes in behavior. No complications noted from the medications presently being used.  The dementia is advanced and the patient does not follow commands.    HTN: Pt 's HTN remains stable.  Staff deny CP, sob, DOE, pedal edema, headaches, dizziness or visual disturbances.  No complications from the medications currently being used.  Last BP : 130/60, 138/68, 122/68, 130/66, 116/56, 130/70.  PAST MEDICAL HISTORY : Reviewed.  No changes.  CURRENT MEDICATIONS: Reviewed per [facility]  REVIEW OF SYSTEMS:  Unobtainable due to advanced dementia.  PHYSICAL EXAMINATION  VS:  T 96.8     P 68      RR 22     BP 130/70    POX %     WT (Lb) 150  GENERAL: no acute distress, normal body habitus NECK: supple, trachea midline, no neck masses, no thyroid tenderness, no thyromegaly RESPIRATORY: breathing is even & unlabored, BS CTAB CARDIAC: RRR, no murmur,no extra heart sounds, no edema GI: abdomen soft, normal BS, no masses, no tenderness, no hepatomegaly, no splenomegaly PSYCHIATRIC: the patient is alert & oriented to person, affect & behavior appropriate  LABS/RADIOLOGY: 6/14 BUN 20, creatinine 1.42, CBC normal 5/14 TSH 1.97 11/2012:  CMP normal.   08/2012:  CBC normal.   ASSESSMENT/PLAN:  Alzheimer's dementia.  Advanced.   Hypertension. Well controlled.  Allergic rhinitis.  Continue Claritin.   Osteoporosis.  Continue Fosamax, calcium and vitamin D.   Depression.  Continue  Lexapro.   Allergic conjunctivitis.  Continue  Patanol.    Constipation.  Well controlled.  Acute renal failure-improved after discontinuation of lisinopril.  CPT CODE: 40981

## 2013-06-01 ENCOUNTER — Non-Acute Institutional Stay (SKILLED_NURSING_FACILITY): Payer: PRIVATE HEALTH INSURANCE | Admitting: Internal Medicine

## 2013-06-01 DIAGNOSIS — I10 Essential (primary) hypertension: Secondary | ICD-10-CM

## 2013-06-01 DIAGNOSIS — F0280 Dementia in other diseases classified elsewhere without behavioral disturbance: Secondary | ICD-10-CM

## 2013-06-01 DIAGNOSIS — J309 Allergic rhinitis, unspecified: Secondary | ICD-10-CM

## 2013-06-01 DIAGNOSIS — M810 Age-related osteoporosis without current pathological fracture: Secondary | ICD-10-CM

## 2013-06-02 NOTE — Progress Notes (Signed)
Patient ID: Tiffany Mckinney, female   DOB: [birth year], 78 y.o.   MRN: [id number]        PROGRESS NOTE  DATE:  06/01/2013  [facility]   LEVEL OF CARE: SNF  Routine Visit  CHIEF COMPLAINT:  Manage Alzheimer's dementia, osteoporosis and hypertension.   HISTORY OF PRESENT ILLNESS:  REASSESSMENT OF ONGOING PROBLEM(S):  DEMENTIA: The dementia remains stable and continues to function adequately in the current living environment with supervision.  The patient has had little changes in behavior. No complications noted from the medications presently being used.  The dementia is advanced and the patient does not follow commands.    HTN: Pt 's HTN remains stable.  Staff deny CP, sob, DOE, pedal edema, headaches, dizziness or visual disturbances.  No complications from the medications currently being used.  Last BP : 130/60, 138/68, 122/68, 130/66, 116/56, 130/70,  130/68.  OSTEOPOROSIS: The patient's osteoporosis remains stable. The staff deny recent worsening of pain and the range of motion remains stable. There has not been any evidence of fractures. No complications noted from the medications presently being used.  PAST MEDICAL HISTORY : Reviewed.  No changes.  CURRENT MEDICATIONS: Reviewed per [facility]  REVIEW OF SYSTEMS:  Unobtainable due to advanced dementia.  PHYSICAL EXAMINATION  VS:  T 98.6.    P 76      RR 20     BP 130/68    POX %     WT (Lb) 152  GENERAL: no acute distress, normal body habitus EYES: Normal sclerae, normal conjunctivae, no discharge NECK: supple, trachea midline, no neck masses, no thyroid tenderness, no thyromegaly LYMPHATICS: No cervical lymphadenopathy, no supraclavicular lymphadenopathy RESPIRATORY: breathing is even & unlabored, BS CTAB CARDIAC: RRR, no murmur,no extra heart sounds, no edema GI: abdomen soft, normal BS, no masses, no tenderness, no hepatomegaly, no splenomegaly PSYCHIATRIC: the patient is alert & disoriented, affect & behavior  appropriate  LABS/RADIOLOGY:  9-14 CMP normal 6/14 BUN 20, creatinine 1.42, CBC normal 5/14 TSH 1.97 11/2012:  CMP normal.   08/2012:  CBC normal.   ASSESSMENT/PLAN:  Alzheimer's dementia.  Advanced.   Hypertension. Well controlled.  Allergic rhinitis.  Continue Claritin.   Osteoporosis.  Continue Fosamax, calcium and vitamin D.   Depression.  Continue  Lexapro.   Allergic conjunctivitis.  Continue Patanol.    Constipation.  Well controlled.  CPT CODE: 40981

## 2013-07-11 ENCOUNTER — Non-Acute Institutional Stay (SKILLED_NURSING_FACILITY): Payer: PRIVATE HEALTH INSURANCE | Admitting: Internal Medicine

## 2013-07-11 DIAGNOSIS — I10 Essential (primary) hypertension: Secondary | ICD-10-CM

## 2013-07-11 DIAGNOSIS — F0280 Dementia in other diseases classified elsewhere without behavioral disturbance: Secondary | ICD-10-CM

## 2013-07-11 DIAGNOSIS — J309 Allergic rhinitis, unspecified: Secondary | ICD-10-CM

## 2013-07-11 DIAGNOSIS — M810 Age-related osteoporosis without current pathological fracture: Secondary | ICD-10-CM

## 2013-07-14 NOTE — Progress Notes (Signed)
Patient ID: Tiffany Mckinney, female   DOB: May 07, 1923, 79 y.o.   MRN: [id number]        PROGRESS NOTE  DATE:  07/11/2013  [facility]AC[facility]   LEVEL OF CARE: SNF  Routine Visit  CHIEF COMPLAINT:  Manage Alzheimer's dementia, osteoporosis and hypertension.   HISTORY OF PRESENT ILLNESS:  REASSESSMENT OF ONGOING PROBLEM(S):  DEMENTIA: The dementia remains stable and continues to function adequately in the current living environment with supervision.  The patient has had little changes in behavior. No complications noted from the medications presently being used.  The dementia is advanced and the patient does not follow commands.    HTN: Pt 's HTN remains stable.  Staff deny CP, sob, DOE, pedal edema, headaches, dizziness or visual disturbances.  No complications from the medications currently being used.  Last BP : 130/60, 138/68, 122/68, 130/66, 116/56, 130/70,  130/68.  OSTEOPOROSIS: The patient's osteoporosis remains stable. The staff deny recent worsening of pain and the range of motion remains stable. There has not been any evidence of fractures. Fosamax was discontinued.  PAST MEDICAL HISTORY : Reviewed.  No changes.  CURRENT MEDICATIONS: Reviewed per [facility]  REVIEW OF SYSTEMS:  Unobtainable due to advanced dementia.  PHYSICAL EXAMINATION  VS:  T 96.4.    P 72      RR 20     BP 130/68    POX %     WT (Lb) 157  GENERAL: no acute distress, normal body habitus EYES: Normal sclerae, normal conjunctivae, no discharge NECK: supple, trachea midline, no neck masses, no thyroid tenderness, no thyromegaly LYMPHATICS: No cervical lymphadenopathy, no supraclavicular lymphadenopathy RESPIRATORY: breathing is even & unlabored, BS CTAB CARDIAC: RRR, no murmur,no extra heart sounds, no edema GI: abdomen soft, normal BS, no masses, no tenderness, no hepatomegaly, no splenomegaly PSYCHIATRIC: the patient is alert & disoriented, affect & behavior appropriate  LABS/RADIOLOGY:  9-14 CMP  normal 6/14 BUN 20, creatinine 1.42, CBC normal 5/14 TSH 1.97 11/2012:  CMP normal.   08/2012:  CBC normal.   ASSESSMENT/PLAN:  Alzheimer's dementia.  Advanced.   Hypertension. Well controlled.  Allergic rhinitis.  Continue Claritin.   Osteoporosis. Fosamax was discontinued  Depression.  Continue  Lexapro.   Allergic conjunctivitis.  Continue Patanol.    Constipation.  Well controlled.  CPT CODE: 40981

## 2013-08-03 ENCOUNTER — Encounter: Payer: Self-pay | Admitting: Internal Medicine

## 2013-08-03 ENCOUNTER — Non-Acute Institutional Stay (SKILLED_NURSING_FACILITY): Payer: PRIVATE HEALTH INSURANCE | Admitting: Internal Medicine

## 2013-08-03 DIAGNOSIS — F0280 Dementia in other diseases classified elsewhere without behavioral disturbance: Secondary | ICD-10-CM

## 2013-08-03 DIAGNOSIS — F329 Major depressive disorder, single episode, unspecified: Secondary | ICD-10-CM

## 2013-08-03 DIAGNOSIS — F3289 Other specified depressive episodes: Secondary | ICD-10-CM

## 2013-08-03 DIAGNOSIS — J309 Allergic rhinitis, unspecified: Secondary | ICD-10-CM

## 2013-08-03 DIAGNOSIS — I10 Essential (primary) hypertension: Secondary | ICD-10-CM

## 2013-08-03 NOTE — Progress Notes (Signed)
Patient ID: Tiffany Mckinney, female   DOB: [birth year], 79 y.o.   MRN: [id number]        PROGRESS NOTE  DATE:  08/03/2013  [facility]   LEVEL OF CARE: SNF  Routine Visit  CHIEF COMPLAINT:  Manage Alzheimer's dementia, allergic rhinitis and hypertension.   HISTORY OF PRESENT ILLNESS:  REASSESSMENT OF ONGOING PROBLEM(S):  DEMENTIA: The dementia remains stable and continues to function adequately in the current living environment with supervision.  The patient has had little changes in behavior. No complications noted from the medications presently being used.  The dementia is advanced and the patient does not follow commands.    HTN: Pt 's HTN remains stable.  Staff deny CP, sob, DOE, pedal edema, headaches, dizziness or visual disturbances.  No complications from the medications currently being used.  Last BP : 130/60, 138/68, 122/68, 130/66, 116/56, 130/70,  130/68, 130/80.  ALLERGIC RHINITIS: Allergic rhinitis remains stable.  staff deny ongoing symptoms such as runny nose sneezing or tearing. No complications reported from the current medication(s) being used.  PAST MEDICAL HISTORY : Reviewed.  No changes.  CURRENT MEDICATIONS: Reviewed per [facility]  REVIEW OF SYSTEMS:  Unobtainable due to advanced dementia.  PHYSICAL EXAMINATION  VS:  T 98.4.    P 75      RR 20     BP 130/80    POX %     WT (Lb) 158  GENERAL: no acute distress, normal body habitus EYES: Normal sclerae, normal conjunctivae, no discharge NECK: supple, trachea midline, no neck masses, no thyroid tenderness, no thyromegaly LYMPHATICS: No cervical lymphadenopathy, no supraclavicular lymphadenopathy RESPIRATORY: breathing is even & unlabored, BS CTAB CARDIAC: RRR, no murmur,no extra heart sounds, no edema GI: abdomen soft, normal BS, no masses, no tenderness, no hepatomegaly, no splenomegaly PSYCHIATRIC: the patient is alert & disoriented, affect & behavior appropriate  LABS/RADIOLOGY:  9-14 CMP  normal 6/14 BUN 20, creatinine 1.42, CBC normal 5/14 TSH 1.97 11/2012:  CMP normal.   08/2012:  CBC normal.   ASSESSMENT/PLAN:  Alzheimer's dementia.  Advanced.   Hypertension. Well controlled.  Allergic rhinitis.  Continue Claritin.   Depression.  Continue  Lexapro.   Allergic conjunctivitis.  Continue Patanol.    Constipation.  Well controlled.  CPT CODE: 40981

## 2013-08-03 NOTE — Addendum Note (Signed)
Addended by: Angela Cox on: 08/03/2013 08:53 PM   Modules accepted: Level of Service

## 2013-08-24 ENCOUNTER — Non-Acute Institutional Stay (SKILLED_NURSING_FACILITY): Payer: PRIVATE HEALTH INSURANCE | Admitting: Internal Medicine

## 2013-08-24 DIAGNOSIS — I10 Essential (primary) hypertension: Secondary | ICD-10-CM

## 2013-08-24 DIAGNOSIS — F0280 Dementia in other diseases classified elsewhere without behavioral disturbance: Secondary | ICD-10-CM

## 2013-08-24 DIAGNOSIS — F329 Major depressive disorder, single episode, unspecified: Secondary | ICD-10-CM

## 2013-08-24 DIAGNOSIS — J309 Allergic rhinitis, unspecified: Secondary | ICD-10-CM

## 2013-08-26 ENCOUNTER — Encounter: Payer: Self-pay | Admitting: Internal Medicine

## 2013-08-26 NOTE — Progress Notes (Signed)
Patient ID: Tiffany Mckinney, female   DOB: 03-Nov-1922, 79 y.o.   MRN: [id number]        PROGRESS NOTE  DATE:  08/24/2013  FACILITY: [facility]   LEVEL OF CARE: SNF  Routine Visit  CHIEF COMPLAINT:  Manage Alzheimer's dementia, allergic rhinitis and hypertension.   HISTORY OF PRESENT ILLNESS:  REASSESSMENT OF ONGOING PROBLEM(S):  DEMENTIA: The dementia remains stable and continues to function adequately in the current living environment with supervision.  The patient has had little changes in behavior. No complications noted from the medications presently being used.  The dementia is advanced and the patient does not follow commands.    HTN: Pt 's HTN remains stable.  Staff deny CP, sob, DOE, pedal edema, headaches, dizziness or visual disturbances.  No complications from the medications currently being used.  Last BP : 130/60, 138/68, 122/68, 130/66, 116/56, 130/70,  130/68, 130/80, 110/70.  ALLERGIC RHINITIS: Allergic rhinitis remains stable.  staff deny ongoing symptoms such as runny nose sneezing or tearing. No complications reported from the current medication(s) being used.  PAST MEDICAL HISTORY : Reviewed.  No changes.  CURRENT MEDICATIONS: Reviewed per [facility]  REVIEW OF SYSTEMS:  Unobtainable due to advanced dementia.  PHYSICAL EXAMINATION  VS:  T 98.1.    P 68      RR 16    BP 110/70    POX %     WT (Lb) 153  GENERAL: no acute distress, normal body habitus EYES: Normal sclerae, normal conjunctivae, no discharge NECK: supple, trachea midline, no neck masses, no thyroid tenderness, no thyromegaly LYMPHATICS: No cervical lymphadenopathy, no supraclavicular lymphadenopathy RESPIRATORY: breathing is even & unlabored, BS CTAB CARDIAC: RRR, no murmur,no extra heart sounds, no edema GI: abdomen soft, normal BS, no masses, no tenderness, no hepatomegaly, no splenomegaly PSYCHIATRIC: the patient is alert & disoriented, affect & behavior appropriate  LABS/RADIOLOGY:  9-14  CMP normal 6/14 BUN 20, creatinine 1.42, CBC normal 5/14 TSH 1.97 11/2012:  CMP normal.   08/2012:  CBC normal.   ASSESSMENT/PLAN:  Alzheimer's dementia.  Advanced.   Hypertension. Well controlled.  Allergic rhinitis.  Continue Claritin.   Depression.  Continue  Lexapro.   Allergic conjunctivitis.  Continue Patanol.    Constipation.  Well controlled.  CPT CODE: 14782

## 2013-09-28 ENCOUNTER — Non-Acute Institutional Stay (SKILLED_NURSING_FACILITY): Payer: PRIVATE HEALTH INSURANCE | Admitting: Internal Medicine

## 2013-09-28 DIAGNOSIS — F0280 Dementia in other diseases classified elsewhere without behavioral disturbance: Secondary | ICD-10-CM

## 2013-09-28 DIAGNOSIS — J309 Allergic rhinitis, unspecified: Secondary | ICD-10-CM

## 2013-09-28 DIAGNOSIS — F3289 Other specified depressive episodes: Secondary | ICD-10-CM

## 2013-09-28 DIAGNOSIS — F329 Major depressive disorder, single episode, unspecified: Secondary | ICD-10-CM

## 2013-09-28 DIAGNOSIS — I10 Essential (primary) hypertension: Secondary | ICD-10-CM

## 2013-09-28 DIAGNOSIS — G309 Alzheimer's disease, unspecified: Principal | ICD-10-CM

## 2013-09-30 NOTE — Progress Notes (Signed)
Patient ID: Tiffany LegacyKatherine M Mckinney, female   DOB: [birth year], 80 y.o.   MRN: [id number]         PROGRESS NOTE  DATE:  09/28/2013  [facility]   LEVEL OF CARE: SNF  Routine Visit  CHIEF COMPLAINT:  Manage Alzheimer's dementia, allergic rhinitis and hypertension.   HISTORY OF PRESENT ILLNESS:  REASSESSMENT OF ONGOING PROBLEM(S):  DEMENTIA: The dementia remains stable and continues to function adequately in the current living environment with supervision.  The patient has had little changes in behavior. No complications noted from the medications presently being used.  The dementia is advanced and the patient does not follow commands.    HTN: Pt 's HTN remains stable.  Staff deny CP, sob, DOE, pedal edema, headaches, dizziness or visual disturbances.  No complications from the medications currently being used.  Last BP : 130/60, 138/68, 122/68, 130/66, 116/56, 130/70,  130/68, 130/80, 110/70, 112/58.  ALLERGIC RHINITIS: Allergic rhinitis remains stable.  staff deny ongoing symptoms such as runny nose sneezing or tearing. No complications reported from the current medication(s) being used.  PAST MEDICAL HISTORY : Reviewed.  No changes.  [facility]  REVIEW OF SYSTEMS:  Unobtainable due to advanced dementia.  PHYSICAL EXAMINATION  VS:  T 96.4    P 64      RR 18    BP 112/58    POX %     WT (Lb) 153  GENERAL: no acute distress, normal body habitus EYES: Normal sclerae, normal conjunctivae, no discharge NECK: supple, trachea midline, no neck masses, no thyroid tenderness, no thyromegaly LYMPHATICS: No cervical lymphadenopathy, no supraclavicular lymphadenopathy RESPIRATORY: breathing is even & unlabored, BS CTAB CARDIAC: RRR, no murmur,no extra heart sounds, no edema GI: abdomen soft, normal BS, no masses, no tenderness, no hepatomegaly, no splenomegaly PSYCHIATRIC: the patient is alert & disoriented, affect & behavior  appropriate  LABS/RADIOLOGY:  12-14 CBC normal  9-14 CMP normal 6/14 BUN 20, creatinine 1.42, CBC normal 5/14 TSH 1.97 11/2012:  CMP normal.   08/2012:  CBC normal.   ASSESSMENT/PLAN:  Alzheimer's dementia.  Advanced.   Hypertension. Well controlled.  Allergic rhinitis.  Continue Claritin.   Depression.  Continue  Lexapro.   Allergic conjunctivitis.  Continue Patanol.    Constipation.  Well controlled.  CPT CODE: [id number]

## 2013-11-26 ENCOUNTER — Inpatient Hospital Stay (HOSPITAL_COMMUNITY)
Admission: EM | Admit: 2013-11-26 | Discharge: 2013-12-02 | DRG: 871 | Disposition: A | Discharge status: Hospice | Payer: PRIVATE HEALTH INSURANCE | Attending: Internal Medicine | Admitting: Internal Medicine

## 2013-11-26 ENCOUNTER — Emergency Department (HOSPITAL_COMMUNITY): Payer: PRIVATE HEALTH INSURANCE

## 2013-11-26 DIAGNOSIS — Z515 Encounter for palliative care: Secondary | ICD-10-CM

## 2013-11-26 DIAGNOSIS — R571 Hypovolemic shock: Secondary | ICD-10-CM | POA: Diagnosis present

## 2013-11-26 DIAGNOSIS — E872 Acidosis, unspecified: Secondary | ICD-10-CM | POA: Diagnosis present

## 2013-11-26 DIAGNOSIS — J9600 Acute respiratory failure, unspecified whether with hypoxia or hypercapnia: Secondary | ICD-10-CM | POA: Diagnosis present

## 2013-11-26 DIAGNOSIS — F411 Generalized anxiety disorder: Secondary | ICD-10-CM | POA: Diagnosis not present

## 2013-11-26 DIAGNOSIS — E876 Hypokalemia: Secondary | ICD-10-CM | POA: Diagnosis present

## 2013-11-26 DIAGNOSIS — R627 Adult failure to thrive: Secondary | ICD-10-CM | POA: Diagnosis present

## 2013-11-26 DIAGNOSIS — R7309 Other abnormal glucose: Secondary | ICD-10-CM | POA: Diagnosis present

## 2013-11-26 DIAGNOSIS — I4891 Unspecified atrial fibrillation: Secondary | ICD-10-CM | POA: Diagnosis present

## 2013-11-26 DIAGNOSIS — R739 Hyperglycemia, unspecified: Secondary | ICD-10-CM | POA: Diagnosis present

## 2013-11-26 DIAGNOSIS — R6520 Severe sepsis without septic shock: Secondary | ICD-10-CM

## 2013-11-26 DIAGNOSIS — R578 Other shock: Secondary | ICD-10-CM | POA: Diagnosis present

## 2013-11-26 DIAGNOSIS — F0280 Dementia in other diseases classified elsewhere without behavioral disturbance: Secondary | ICD-10-CM | POA: Diagnosis present

## 2013-11-26 DIAGNOSIS — N179 Acute kidney failure, unspecified: Secondary | ICD-10-CM | POA: Diagnosis present

## 2013-11-26 DIAGNOSIS — G309 Alzheimer's disease, unspecified: Secondary | ICD-10-CM | POA: Diagnosis present

## 2013-11-26 DIAGNOSIS — R1310 Dysphagia, unspecified: Secondary | ICD-10-CM | POA: Diagnosis present

## 2013-11-26 DIAGNOSIS — F3289 Other specified depressive episodes: Secondary | ICD-10-CM | POA: Diagnosis present

## 2013-11-26 DIAGNOSIS — R778 Other specified abnormalities of plasma proteins: Secondary | ICD-10-CM | POA: Diagnosis present

## 2013-11-26 DIAGNOSIS — J15211 Pneumonia due to Methicillin susceptible Staphylococcus aureus: Secondary | ICD-10-CM | POA: Diagnosis present

## 2013-11-26 DIAGNOSIS — J690 Pneumonitis due to inhalation of food and vomit: Secondary | ICD-10-CM | POA: Diagnosis present

## 2013-11-26 DIAGNOSIS — I214 Non-ST elevation (NSTEMI) myocardial infarction: Secondary | ICD-10-CM | POA: Diagnosis not present

## 2013-11-26 DIAGNOSIS — A419 Sepsis, unspecified organism: Principal | ICD-10-CM | POA: Diagnosis present

## 2013-11-26 DIAGNOSIS — R4182 Altered mental status, unspecified: Secondary | ICD-10-CM

## 2013-11-26 DIAGNOSIS — E860 Dehydration: Secondary | ICD-10-CM

## 2013-11-26 DIAGNOSIS — N390 Urinary tract infection, site not specified: Secondary | ICD-10-CM | POA: Diagnosis present

## 2013-11-26 DIAGNOSIS — Z79899 Other long term (current) drug therapy: Secondary | ICD-10-CM

## 2013-11-26 DIAGNOSIS — IMO0002 Reserved for concepts with insufficient information to code with codable children: Secondary | ICD-10-CM | POA: Diagnosis not present

## 2013-11-26 DIAGNOSIS — Z66 Do not resuscitate: Secondary | ICD-10-CM | POA: Diagnosis not present

## 2013-11-26 DIAGNOSIS — E870 Hyperosmolality and hypernatremia: Secondary | ICD-10-CM | POA: Diagnosis present

## 2013-11-26 DIAGNOSIS — I10 Essential (primary) hypertension: Secondary | ICD-10-CM | POA: Diagnosis present

## 2013-11-26 DIAGNOSIS — E43 Unspecified severe protein-calorie malnutrition: Secondary | ICD-10-CM

## 2013-11-26 DIAGNOSIS — R7989 Other specified abnormal findings of blood chemistry: Secondary | ICD-10-CM

## 2013-11-26 DIAGNOSIS — D649 Anemia, unspecified: Secondary | ICD-10-CM | POA: Diagnosis present

## 2013-11-26 DIAGNOSIS — F329 Major depressive disorder, single episode, unspecified: Secondary | ICD-10-CM | POA: Diagnosis present

## 2013-11-26 DIAGNOSIS — G9341 Metabolic encephalopathy: Secondary | ICD-10-CM | POA: Diagnosis not present

## 2013-11-26 DIAGNOSIS — M810 Age-related osteoporosis without current pathological fracture: Secondary | ICD-10-CM | POA: Diagnosis present

## 2013-11-26 HISTORY — DX: Essential (primary) hypertension: I10

## 2013-11-26 HISTORY — DX: Dementia in other diseases classified elsewhere without behavioral disturbance: F02.80

## 2013-11-26 HISTORY — DX: Allergic rhinitis, unspecified: J30.9

## 2013-11-26 HISTORY — DX: Unspecified fracture of facial bones, initial encounter for closed fracture: S02.92XA

## 2013-11-26 HISTORY — DX: Unspecified fracture of unspecified femur, initial encounter for closed fracture: S72.90XA

## 2013-11-26 HISTORY — DX: Age-related osteoporosis without current pathological fracture: M81.0

## 2013-11-26 HISTORY — DX: Alzheimer's disease, unspecified: G30.9

## 2013-11-26 LAB — CBG MONITORING, ED: Glucose-Capillary: 250 mg/dL — ABNORMAL HIGH (ref 70–99)

## 2013-11-26 MED ORDER — SODIUM CHLORIDE 0.9 % IV BOLUS (SEPSIS)
1000.0000 mL | Freq: Once | INTRAVENOUS | Status: AC
  Administered 2013-11-26 (×2): 1000 mL via INTRAVENOUS

## 2013-11-26 MED ORDER — ETOMIDATE 2 MG/ML IV SOLN
INTRAVENOUS | Status: AC
  Administered 2013-11-26: 20 mg
  Filled 2013-11-26: qty 20

## 2013-11-26 MED ORDER — VANCOMYCIN HCL IN DEXTROSE 1-5 GM/200ML-% IV SOLN
1000.0000 mg | Freq: Once | INTRAVENOUS | Status: AC
  Administered 2013-11-27: 1000 mg via INTRAVENOUS
  Filled 2013-11-26: qty 200

## 2013-11-26 MED ORDER — SUCCINYLCHOLINE CHLORIDE 20 MG/ML IJ SOLN
INTRAMUSCULAR | Status: AC
  Administered 2013-11-26: 100 mg
  Filled 2013-11-26: qty 1

## 2013-11-26 MED ORDER — ROCURONIUM BROMIDE 50 MG/5ML IV SOLN
INTRAVENOUS | Status: AC
  Administered 2013-11-26
  Filled 2013-11-26: qty 2

## 2013-11-26 MED ORDER — SODIUM CHLORIDE 0.9 % IV SOLN
Freq: Once | INTRAVENOUS | Status: AC
  Administered 2013-11-26: via INTRAVENOUS

## 2013-11-26 MED ORDER — PIPERACILLIN-TAZOBACTAM 4.5 G IVPB
4.5000 g | Freq: Once | INTRAVENOUS | Status: AC
  Administered 2013-11-27: 4.5 g via INTRAVENOUS
  Filled 2013-11-26: qty 100

## 2013-11-26 MED ORDER — LIDOCAINE HCL (CARDIAC) 20 MG/ML IV SOLN
INTRAVENOUS | Status: AC
  Administered 2013-11-26
  Filled 2013-11-26: qty 5

## 2013-11-26 NOTE — ED Notes (Signed)
MD at bedside. 

## 2013-11-26 NOTE — ED Notes (Signed)
1st US guided attempt unsuccessful on R upper arm (Dr. Marcha SoldersBrtalik).

## 2013-11-26 NOTE — ED Notes (Signed)
Per EMS, patient was found with altered mental status at [facility].  Unsure how long she had altered mental status.  They obtained blood sugar reading of 321 at the nursing facility, and obtained IV access there as well, 20g in right hand.  Patient is on 2L nasal cannula, was in A-Fib on the monitor enroute, with heart rate ranging between 80-120.  History of dementia.

## 2013-11-26 NOTE — ED Notes (Signed)
Green, lavendar and red bullets obtained from blood draw, sent.

## 2013-11-26 NOTE — ED Notes (Signed)
Poor SPO2 signal, intermittant.

## 2013-11-26 NOTE — ED Notes (Signed)
Dr. Marcha SoldersBrtalik intubating. Difficult intubation, rebagging with NRB/mask.

## 2013-11-26 NOTE — ED Notes (Signed)
Informed Dr. Shyrl NumbersNanavanti about heart rate in 160's and repeat EKG.

## 2013-11-26 NOTE — ED Notes (Signed)
Pt into room Trauma C. Drs Rhunette CroftNanavati and [facility]ik [facility] Attempting IV access. No success (this RN) x2 sticks. Dr[facility] guided attempt. HR 117. SPO2 100% 15L NRB with OPA.

## 2013-11-26 NOTE — ED Notes (Signed)
Drs. Rhunette CroftNanavati and Marcha SoldersBrtalik at at [facility] for [facility]. Preparing for intubation and CT head. VSS.

## 2013-11-26 NOTE — ED Notes (Signed)
CBG reading 250

## 2013-11-27 ENCOUNTER — Emergency Department (HOSPITAL_COMMUNITY): Payer: PRIVATE HEALTH INSURANCE

## 2013-11-27 ENCOUNTER — Inpatient Hospital Stay (HOSPITAL_COMMUNITY): Payer: PRIVATE HEALTH INSURANCE

## 2013-11-27 ENCOUNTER — Encounter (HOSPITAL_COMMUNITY): Payer: Self-pay | Admitting: Internal Medicine

## 2013-11-27 DIAGNOSIS — R571 Hypovolemic shock: Secondary | ICD-10-CM | POA: Diagnosis present

## 2013-11-27 DIAGNOSIS — A419 Sepsis, unspecified organism: Secondary | ICD-10-CM | POA: Diagnosis present

## 2013-11-27 DIAGNOSIS — R739 Hyperglycemia, unspecified: Secondary | ICD-10-CM | POA: Diagnosis present

## 2013-11-27 DIAGNOSIS — E872 Acidosis, unspecified: Secondary | ICD-10-CM | POA: Diagnosis present

## 2013-11-27 DIAGNOSIS — IMO0002 Reserved for concepts with insufficient information to code with codable children: Secondary | ICD-10-CM | POA: Diagnosis present

## 2013-11-27 DIAGNOSIS — E870 Hyperosmolality and hypernatremia: Secondary | ICD-10-CM | POA: Diagnosis present

## 2013-11-27 DIAGNOSIS — D649 Anemia, unspecified: Secondary | ICD-10-CM | POA: Diagnosis present

## 2013-11-27 DIAGNOSIS — R778 Other specified abnormalities of plasma proteins: Secondary | ICD-10-CM | POA: Diagnosis present

## 2013-11-27 DIAGNOSIS — R4182 Altered mental status, unspecified: Secondary | ICD-10-CM | POA: Diagnosis present

## 2013-11-27 DIAGNOSIS — N390 Urinary tract infection, site not specified: Secondary | ICD-10-CM | POA: Diagnosis present

## 2013-11-27 DIAGNOSIS — R7989 Other specified abnormal findings of blood chemistry: Secondary | ICD-10-CM

## 2013-11-27 DIAGNOSIS — R578 Other shock: Secondary | ICD-10-CM

## 2013-11-27 LAB — I-STAT ARTERIAL BLOOD GAS, ED
Acid-base deficit: 7 mmol/L — ABNORMAL HIGH (ref 0.0–2.0)
Bicarbonate: 17 mEq/L — ABNORMAL LOW (ref 20.0–24.0)
O2 SAT: 100 %
PCO2 ART: 33.1 mmHg — AB (ref 35.0–45.0)
PH ART: 7.329 — AB (ref 7.350–7.450)
Patient temperature: 102.3
TCO2: 18 mmol/L (ref 0–100)
pO2, Arterial: 516 mmHg — ABNORMAL HIGH (ref 80.0–100.0)

## 2013-11-27 LAB — COMPREHENSIVE METABOLIC PANEL
ALT: 34 U/L (ref 0–35)
AST: 35 U/L (ref 0–37)
Albumin: 3.3 g/dL — ABNORMAL LOW (ref 3.5–5.2)
Alkaline Phosphatase: 96 U/L (ref 39–117)
BUN: 103 mg/dL — ABNORMAL HIGH (ref 6–23)
CALCIUM: 10.5 mg/dL (ref 8.4–10.5)
CO2: 13 meq/L — AB (ref 19–32)
Creatinine, Ser: 3.38 mg/dL — ABNORMAL HIGH (ref 0.50–1.10)
GFR calc Af Amer: 13 mL/min — ABNORMAL LOW (ref >90)
GFR, EST NON AFRICAN AMERICAN: 11 mL/min — AB (ref >90)
Glucose, Bld: 286 mg/dL — ABNORMAL HIGH (ref 70–99)
Potassium: 5.1 mEq/L (ref 3.7–5.3)
SODIUM: 170 meq/L — AB (ref 137–147)
Total Bilirubin: 0.6 mg/dL (ref 0.3–1.2)
Total Protein: 7.9 g/dL (ref 6.0–8.3)

## 2013-11-27 LAB — URINALYSIS, ROUTINE W REFLEX MICROSCOPIC
GLUCOSE, UA: NEGATIVE mg/dL
Ketones, ur: 15 mg/dL — AB
Nitrite: NEGATIVE
Protein, ur: >300 mg/dL — AB
SPECIFIC GRAVITY, URINE: 1.026 (ref 1.005–1.030)
Urobilinogen, UA: 0.2 mg/dL (ref 0.0–1.0)
pH: 8.5 — ABNORMAL HIGH (ref 5.0–8.0)

## 2013-11-27 LAB — POCT I-STAT 3, ART BLOOD GAS (G3+)
Acid-base deficit: 11 mmol/L — ABNORMAL HIGH (ref 0.0–2.0)
Acid-base deficit: 11 mmol/L — ABNORMAL HIGH (ref 0.0–2.0)
BICARBONATE: 14.4 meq/L — AB (ref 20.0–24.0)
Bicarbonate: 13.5 mEq/L — ABNORMAL LOW (ref 20.0–24.0)
O2 Saturation: 100 %
O2 Saturation: 99 %
PCO2 ART: 29.7 mmHg — AB (ref 35.0–45.0)
PCO2 ART: 27 mmHg — AB (ref 35.0–45.0)
PH ART: 7.292 — AB (ref 7.350–7.450)
PH ART: 7.312 — AB (ref 7.350–7.450)
PO2 ART: 308 mmHg — AB (ref 80.0–100.0)
Patient temperature: 98.1
TCO2: 15 mmol/L (ref 0–100)
TCO2: 14 mmol/L (ref 0–100)
pO2, Arterial: 164 mmHg — ABNORMAL HIGH (ref 80.0–100.0)

## 2013-11-27 LAB — BASIC METABOLIC PANEL
BUN: 99 mg/dL — ABNORMAL HIGH (ref 6–23)
BUN: 93 mg/dL — ABNORMAL HIGH (ref 6–23)
BUN: 90 mg/dL — ABNORMAL HIGH (ref 6–23)
BUN: 83 mg/dL — ABNORMAL HIGH (ref 6–23)
CALCIUM: 7.5 mg/dL — AB (ref 8.4–10.5)
CO2: 14 meq/L — AB (ref 19–32)
CO2: 14 mEq/L — ABNORMAL LOW (ref 19–32)
CO2: 13 mEq/L — ABNORMAL LOW (ref 19–32)
CO2: 13 meq/L — AB (ref 19–32)
Calcium: 7.5 mg/dL — ABNORMAL LOW (ref 8.4–10.5)
Calcium: 7.9 mg/dL — ABNORMAL LOW (ref 8.4–10.5)
Calcium: 7.7 mg/dL — ABNORMAL LOW (ref 8.4–10.5)
Chloride: >130 mEq/L (ref 96–112)
Chloride: >130 mEq/L (ref 96–112)
Creatinine, Ser: 2.71 mg/dL — ABNORMAL HIGH (ref 0.50–1.10)
Creatinine, Ser: 2.6 mg/dL — ABNORMAL HIGH (ref 0.50–1.10)
Creatinine, Ser: 2.6 mg/dL — ABNORMAL HIGH (ref 0.50–1.10)
Creatinine, Ser: 2.45 mg/dL — ABNORMAL HIGH (ref 0.50–1.10)
GFR calc Af Amer: 17 mL/min — ABNORMAL LOW (ref >90)
GFR calc Af Amer: 19 mL/min — ABNORMAL LOW (ref >90)
GFR calc non Af Amer: 15 mL/min — ABNORMAL LOW (ref >90)
GFR calc non Af Amer: 17 mL/min — ABNORMAL LOW (ref >90)
GFR, EST AFRICAN AMERICAN: 18 mL/min — AB (ref >90)
GFR, EST AFRICAN AMERICAN: 18 mL/min — AB (ref >90)
GFR, EST NON AFRICAN AMERICAN: 15 mL/min — AB (ref >90)
GFR, EST NON AFRICAN AMERICAN: 15 mL/min — AB (ref >90)
Glucose, Bld: 386 mg/dL — ABNORMAL HIGH (ref 70–99)
Glucose, Bld: 348 mg/dL — ABNORMAL HIGH (ref 70–99)
Glucose, Bld: 149 mg/dL — ABNORMAL HIGH (ref 70–99)
Glucose, Bld: 163 mg/dL — ABNORMAL HIGH (ref 70–99)
POTASSIUM: 3.6 meq/L — AB (ref 3.7–5.3)
POTASSIUM: 3.4 meq/L — AB (ref 3.7–5.3)
Potassium: 3.9 mEq/L (ref 3.7–5.3)
Potassium: 3.5 mEq/L — ABNORMAL LOW (ref 3.7–5.3)
SODIUM: 166 meq/L — AB (ref 137–147)
SODIUM: 166 meq/L — AB (ref 137–147)
SODIUM: 165 meq/L — AB (ref 137–147)
Sodium: 168 mEq/L (ref 137–147)

## 2013-11-27 LAB — TROPONIN I
TROPONIN I: 0.48 ng/mL — AB (ref <0.30)
TROPONIN I: 0.58 ng/mL — AB (ref <0.30)
TROPONIN I: 0.58 ng/mL — AB (ref <0.30)
Troponin I: 0.58 ng/mL (ref <0.30)

## 2013-11-27 LAB — URINE MICROSCOPIC-ADD ON

## 2013-11-27 LAB — CBC
HCT: 36.3 % (ref 36.0–46.0)
HCT: 50.6 % — ABNORMAL HIGH (ref 36.0–46.0)
HEMOGLOBIN: 11.3 g/dL — AB (ref 12.0–15.0)
Hemoglobin: 16.4 g/dL — ABNORMAL HIGH (ref 12.0–15.0)
MCH: 31.5 pg (ref 26.0–34.0)
MCH: 31.8 pg (ref 26.0–34.0)
MCHC: 31.1 g/dL (ref 30.0–36.0)
MCHC: 32.4 g/dL (ref 30.0–36.0)
MCV: 101.1 fL — AB (ref 78.0–100.0)
MCV: 98.1 fL (ref 78.0–100.0)
Platelets: 147 10*3/uL — ABNORMAL LOW (ref 150–400)
Platelets: 204 10*3/uL (ref 150–400)
RBC: 3.59 MIL/uL — ABNORMAL LOW (ref 3.87–5.11)
RBC: 5.16 MIL/uL — AB (ref 3.87–5.11)
RDW: 14.9 % (ref 11.5–15.5)
RDW: 15 % (ref 11.5–15.5)
WBC: 23.9 10*3/uL — ABNORMAL HIGH (ref 4.0–10.5)
WBC: 32.4 10*3/uL — AB (ref 4.0–10.5)

## 2013-11-27 LAB — GLUCOSE, CAPILLARY
GLUCOSE-CAPILLARY: 263 mg/dL — AB (ref 70–99)
GLUCOSE-CAPILLARY: 294 mg/dL — AB (ref 70–99)
GLUCOSE-CAPILLARY: 304 mg/dL — AB (ref 70–99)
GLUCOSE-CAPILLARY: 258 mg/dL — AB (ref 70–99)
GLUCOSE-CAPILLARY: 136 mg/dL — AB (ref 70–99)
GLUCOSE-CAPILLARY: 116 mg/dL — AB (ref 70–99)
GLUCOSE-CAPILLARY: 180 mg/dL — AB (ref 70–99)
GLUCOSE-CAPILLARY: 242 mg/dL — AB (ref 70–99)
Glucose-Capillary: 217 mg/dL — ABNORMAL HIGH (ref 70–99)
Glucose-Capillary: 219 mg/dL — ABNORMAL HIGH (ref 70–99)
Glucose-Capillary: 122 mg/dL — ABNORMAL HIGH (ref 70–99)
Glucose-Capillary: 105 mg/dL — ABNORMAL HIGH (ref 70–99)
Glucose-Capillary: 138 mg/dL — ABNORMAL HIGH (ref 70–99)
Glucose-Capillary: 208 mg/dL — ABNORMAL HIGH (ref 70–99)
Glucose-Capillary: 260 mg/dL — ABNORMAL HIGH (ref 70–99)

## 2013-11-27 LAB — PROTIME-INR
INR: 1.29 (ref 0.00–1.49)
Prothrombin Time: 15.8 seconds — ABNORMAL HIGH (ref 11.6–15.2)

## 2013-11-27 LAB — MRSA PCR SCREENING: MRSA BY PCR: NEGATIVE

## 2013-11-27 LAB — PHOSPHORUS: Phosphorus: 3.4 mg/dL (ref 2.3–4.6)

## 2013-11-27 LAB — CREATININE, SERUM
Creatinine, Ser: 2.67 mg/dL — ABNORMAL HIGH (ref 0.50–1.10)
GFR calc Af Amer: 17 mL/min — ABNORMAL LOW (ref >90)
GFR calc non Af Amer: 15 mL/min — ABNORMAL LOW (ref >90)

## 2013-11-27 LAB — LACTIC ACID, PLASMA: LACTIC ACID, VENOUS: 1.9 mmol/L (ref 0.5–2.2)

## 2013-11-27 LAB — SODIUM, URINE, RANDOM: SODIUM UR: 81 meq/L

## 2013-11-27 LAB — CREATININE, URINE, RANDOM: CREATININE, URINE: 31.88 mg/dL

## 2013-11-27 LAB — I-STAT CG4 LACTIC ACID, ED: LACTIC ACID, VENOUS: 3.67 mmol/L — AB (ref 0.5–2.2)

## 2013-11-27 LAB — APTT: APTT: 24 s (ref 24–37)

## 2013-11-27 LAB — MAGNESIUM: Magnesium: 2.5 mg/dL (ref 1.5–2.5)

## 2013-11-27 LAB — TSH: TSH: 0.425 u[IU]/mL (ref 0.350–4.500)

## 2013-11-27 MED ORDER — VITAMIN D (ERGOCALCIFEROL) 1.25 MG (50000 UNIT) PO CAPS
50000.0000 [IU] | ORAL_CAPSULE | ORAL | Status: DC
  Filled 2013-11-27: qty 1

## 2013-11-27 MED ORDER — CHLORHEXIDINE GLUCONATE 0.12 % MT SOLN
OROMUCOSAL | Status: AC
  Administered 2013-11-27: 15 mL
  Filled 2013-11-27: qty 15

## 2013-11-27 MED ORDER — ACETAMINOPHEN 160 MG/5ML PO SOLN
650.0000 mg | ORAL | Status: DC | PRN
  Administered 2013-11-27: 650 mg
  Filled 2013-11-27: qty 20.3

## 2013-11-27 MED ORDER — PRO-STAT SUGAR FREE PO LIQD
30.0000 mL | Freq: Two times a day (BID) | ORAL | Status: DC
  Administered 2013-11-27 – 2013-11-28 (×2): 30 mL
  Filled 2013-11-27 (×7): qty 30

## 2013-11-27 MED ORDER — SODIUM CHLORIDE 0.45 % IV SOLN
INTRAVENOUS | Status: DC
  Administered 2013-11-27 – 2013-11-28 (×3): via INTRAVENOUS

## 2013-11-27 MED ORDER — PANTOPRAZOLE SODIUM 40 MG IV SOLR
40.0000 mg | INTRAVENOUS | Status: DC
  Administered 2013-11-27 – 2013-11-28 (×2): 40 mg via INTRAVENOUS
  Filled 2013-11-27 (×3): qty 40

## 2013-11-27 MED ORDER — ESCITALOPRAM OXALATE 20 MG PO TABS
20.0000 mg | ORAL_TABLET | Freq: Every day | ORAL | Status: DC
  Administered 2013-11-27 – 2013-11-30 (×3): 20 mg via ORAL
  Filled 2013-11-27 (×5): qty 1

## 2013-11-27 MED ORDER — PROPOFOL 10 MG/ML IV EMUL
5.0000 ug/kg/min | Freq: Once | INTRAVENOUS | Status: AC
  Administered 2013-11-27: 1000 mg via INTRAVENOUS
  Administered 2013-11-27: 20 ug/kg/min via INTRAVENOUS

## 2013-11-27 MED ORDER — LORAZEPAM 2 MG/ML IJ SOLN
4.0000 mg | Freq: Once | INTRAMUSCULAR | Status: AC
  Administered 2013-11-27: 2 mg via INTRAVENOUS

## 2013-11-27 MED ORDER — SODIUM CHLORIDE 0.9 % IV SOLN: 250.0000 mL | INTRAVENOUS | Status: DC | PRN

## 2013-11-27 MED ORDER — HEPARIN SODIUM (PORCINE) 5000 UNIT/ML IJ SOLN
5000.0000 [IU] | Freq: Three times a day (TID) | INTRAMUSCULAR | Status: DC
  Administered 2013-11-27 – 2013-12-01 (×13): 5000 [IU] via SUBCUTANEOUS
  Filled 2013-11-27 (×16): qty 1

## 2013-11-27 MED ORDER — VITAL AF 1.2 CAL PO LIQD
1000.0000 mL | ORAL | Status: DC
  Administered 2013-11-29: 1000 mL
  Filled 2013-11-27 (×5): qty 1000

## 2013-11-27 MED ORDER — ADULT MULTIVITAMIN W/MINERALS CH
1.0000 | ORAL_TABLET | Freq: Every day | ORAL | Status: DC
  Administered 2013-11-27 – 2013-11-28 (×2): 1 via ORAL
  Filled 2013-11-27 (×4): qty 1

## 2013-11-27 MED ORDER — NOREPINEPHRINE BITARTRATE 1 MG/ML IJ SOLN
2.0000 ug/min | INTRAVENOUS | Status: DC
  Administered 2013-11-27: 18 ug/min via INTRAVENOUS
  Administered 2013-11-27: 20 ug/min via INTRAVENOUS
  Administered 2013-11-27: 16 ug/min via INTRAVENOUS
  Administered 2013-11-27: 22 ug/min via INTRAVENOUS
  Administered 2013-11-27: 12 ug/min via INTRAVENOUS
  Administered 2013-11-27: 23 ug/min via INTRAVENOUS
  Filled 2013-11-27 (×2): qty 16

## 2013-11-27 MED ORDER — FENTANYL CITRATE 0.05 MG/ML IJ SOLN
50.0000 ug | Freq: Once | INTRAMUSCULAR | Status: AC
  Administered 2013-11-27: 50 ug via INTRAVENOUS

## 2013-11-27 MED ORDER — SODIUM CHLORIDE 0.9 % IV BOLUS (SEPSIS)
1000.0000 mL | Freq: Once | INTRAVENOUS | Status: AC
  Administered 2013-11-27: 1000 mL via INTRAVENOUS

## 2013-11-27 MED ORDER — DOCUSATE SODIUM 50 MG/5ML PO LIQD
100.0000 mg | Freq: Every day | ORAL | Status: DC
  Administered 2013-11-27 – 2013-11-30 (×3): 100 mg
  Filled 2013-11-27 (×5): qty 10

## 2013-11-27 MED ORDER — INSULIN ASPART 100 UNIT/ML ~~LOC~~ SOLN: 1.0000 [IU] | SUBCUTANEOUS | Status: DC

## 2013-11-27 MED ORDER — FENTANYL CITRATE 0.05 MG/ML IJ SOLN
INTRAMUSCULAR | Status: AC
  Administered 2013-11-27
  Filled 2013-11-27: qty 2

## 2013-11-27 MED ORDER — VITAL HIGH PROTEIN PO LIQD
1000.0000 mL | ORAL | Status: DC
  Administered 2013-11-27: 1000 mL
  Filled 2013-11-27 (×2): qty 1000

## 2013-11-27 MED ORDER — DOCUSATE SODIUM 100 MG PO CAPS
100.0000 mg | ORAL_CAPSULE | Freq: Every day | ORAL | Status: DC
Start: 2013-11-27 — End: 2013-11-27
  Filled 2013-11-27: qty 1

## 2013-11-27 MED ORDER — SODIUM CHLORIDE 0.45 % IV SOLN
INTRAVENOUS | Status: DC
  Administered 2013-11-27 – 2013-11-28 (×2): 75 mL/h via INTRAVENOUS

## 2013-11-27 MED ORDER — CHLORHEXIDINE GLUCONATE 0.12 % MT SOLN
15.0000 mL | Freq: Two times a day (BID) | OROMUCOSAL | Status: DC
  Administered 2013-11-27 – 2013-12-01 (×7): 15 mL via OROMUCOSAL
  Filled 2013-11-27 (×12): qty 15

## 2013-11-27 MED ORDER — PIPERACILLIN-TAZOBACTAM IN DEX 2-0.25 GM/50ML IV SOLN
2.2500 g | Freq: Four times a day (QID) | INTRAVENOUS | Status: DC
  Administered 2013-11-27 – 2013-11-29 (×9): 2.25 g via INTRAVENOUS
  Filled 2013-11-27 (×13): qty 50

## 2013-11-27 MED ORDER — SODIUM CHLORIDE 0.9 % IV SOLN
INTRAVENOUS | Status: DC
  Administered 2013-11-27: 2.3 [IU]/h via INTRAVENOUS
  Administered 2013-11-27: 5.5 [IU]/h via INTRAVENOUS
  Administered 2013-11-27: 1.2 [IU]/h via INTRAVENOUS
  Administered 2013-11-28: 3 [IU]/h via INTRAVENOUS
  Administered 2013-11-28: 3.2 [IU]/h via INTRAVENOUS
  Administered 2013-11-28: 2.9 [IU]/h via INTRAVENOUS
  Administered 2013-11-28: 3.2 [IU]/h via INTRAVENOUS
  Filled 2013-11-27 (×2): qty 1

## 2013-11-27 MED ORDER — HYDROCORTISONE NA SUCCINATE PF 100 MG IJ SOLR
50.0000 mg | Freq: Four times a day (QID) | INTRAMUSCULAR | Status: DC
  Administered 2013-11-27 – 2013-11-28 (×5): 50 mg via INTRAVENOUS
  Filled 2013-11-27 (×8): qty 1

## 2013-11-27 MED ORDER — OLOPATADINE HCL 0.1 % OP SOLN
1.0000 [drp] | Freq: Two times a day (BID) | OPHTHALMIC | Status: DC
  Administered 2013-11-27 – 2013-12-01 (×10): 1 [drp] via OPHTHALMIC
  Filled 2013-11-27 (×2): qty 5

## 2013-11-27 MED ORDER — SODIUM CHLORIDE 0.45 % IV BOLUS
1000.0000 mL | Freq: Once | INTRAVENOUS | Status: AC
  Administered 2013-11-27: 1000 mL via INTRAVENOUS

## 2013-11-27 MED ORDER — NOREPINEPHRINE BITARTRATE 1 MG/ML IJ SOLN
2.0000 ug/min | Freq: Once | INTRAVENOUS | Status: AC
  Administered 2013-11-27: 5 ug/min via INTRAVENOUS
  Filled 2013-11-27: qty 4

## 2013-11-27 MED ORDER — VANCOMYCIN HCL IN DEXTROSE 1-5 GM/200ML-% IV SOLN
1000.0000 mg | INTRAVENOUS | Status: DC
  Administered 2013-11-29: 1000 mg via INTRAVENOUS
  Filled 2013-11-27: qty 200

## 2013-11-27 MED ORDER — LORATADINE 10 MG PO TABS
10.0000 mg | ORAL_TABLET | Freq: Every day | ORAL | Status: DC
  Administered 2013-11-27 – 2013-11-30 (×3): 10 mg via ORAL
  Filled 2013-11-27 (×5): qty 1

## 2013-11-27 MED ORDER — PROPOFOL 10 MG/ML IV EMUL
5.0000 ug/kg/min | INTRAVENOUS | Status: DC
  Administered 2013-11-27: 50 ug/kg/min via INTRAVENOUS
  Administered 2013-11-27: 25 ug/kg/min via INTRAVENOUS
  Administered 2013-11-27: 30 ug/kg/min via INTRAVENOUS
  Administered 2013-11-28: 5 ug/kg/min via INTRAVENOUS
  Administered 2013-11-28: 15 ug/kg/min via INTRAVENOUS
  Administered 2013-11-28: 2 ug/kg/min via INTRAVENOUS
  Filled 2013-11-27 (×3): qty 100

## 2013-11-27 MED ORDER — LORAZEPAM 2 MG/ML IJ SOLN
INTRAMUSCULAR | Status: AC
  Administered 2013-11-27
  Filled 2013-11-27: qty 2

## 2013-11-27 MED ORDER — NOREPINEPHRINE BITARTRATE 1 MG/ML IJ SOLN
2.0000 ug/min | INTRAMUSCULAR | Status: DC
  Administered 2013-11-28: 5 ug/min via INTRAVENOUS
  Administered 2013-11-28: 6 ug/min via INTRAVENOUS
  Filled 2013-11-27 (×2): qty 16

## 2013-11-27 MED ORDER — PROPOFOL 10 MG/ML IV EMUL
INTRAVENOUS | Status: AC
  Administered 2013-11-27: 1000 mg via INTRAVENOUS
  Filled 2013-11-27: qty 100

## 2013-11-27 NOTE — ED Notes (Signed)
Level 1 code sepsis initiated by EDP

## 2013-11-27 NOTE — H&P (Signed)
PULMONARY / CRITICAL CARE MEDICINE   Name: Tiffany LegacyKatherine M Streety MRN: [id number] DOB: Apr 09, 1923    ADMISSION DATE:  11/26/2013  PRIMARY SERVICE: PCCM  CHIEF COMPLAINT:  AMS  BRIEF PATIENT DESCRIPTION: [age] F with End-Stage Alzheimer's Disease, ward of state, transferred from nursing home with AMS, hypotension likely 2/2 worsening AMS 2/2 UTI leading to dehydration.  SIGNIFICANT EVENTS / STUDIES:  Intubated in ED 3/16 R [place] CVC placed by ED knotted and replaced 3/16  LINES / TUBES: ETT 3/16 R [place] CVC 3/16 R Radial Arterial Line 3/16 Foley Catheter 3/16  CULTURES: Blood Culture x 2 3/16 Urine Culture  3/16 Sputum Culture 3/16  ANTIBIOTICS: Vanc 3/16- Zosyn 3/16-  HISTORY OF PRESENT ILLNESS:  Tiffany Mckinney is an 80 yo F with End-Stage Alzheimer's Disease who [facility] with AMS and hypotension. She is unable to provide history; the following is obtained from a chart review. This evening she was found obtunded from a baseline of alert but unable to follow commands. When EMS arrived she had a FSG of > 300, was in Afib. On arrival her GCS was 5 and she was intubated for airway protection. A right subclavian CVC was placed in the ED but was knotted and had to be removed and replaced by their service.   PAST MEDICAL HISTORY :  Past Medical History  Diagnosis Date  . Alzheimer's disease 01/20/2013  . Essential hypertension, benign 01/20/2013  . Osteoporosis, unspecified 01/20/2013  . Allergic rhinitis, cause unspecified 01/20/2013  . Femur fracture     Remote, Details Unknown  . Facial fracture     Remote, details unknown   History reviewed. No pertinent past surgical history. Prior to Admission medications   Medication Sig Start Date End Date Taking? Authorizing Provider  amLODipine (NORVASC) 10 MG tablet Take 10 mg by mouth daily.   Yes Historical Provider, MD  docusate sodium (COLACE) 100 MG capsule Take 100 mg by mouth  daily.   Yes Historical Provider, MD  escitalopram (LEXAPRO) 20 MG tablet Take 20 mg by mouth daily.   Yes Historical Provider, MD  lactose free nutrition (BOOST) LIQD Take 237 mLs by mouth 3 (three) times daily with meals.   Yes Historical Provider, MD  loratadine (CLARITIN) 10 MG tablet Take 10 mg by mouth daily.   Yes Historical Provider, MD  metoprolol tartrate (LOPRESSOR) 25 MG tablet Take 25 mg by mouth daily.   Yes Historical Provider, MD  Multiple Vitamin (MULTIVITAMIN WITH MINERALS) TABS tablet Take 1 tablet by mouth daily.   Yes Historical Provider, MD  olopatadine (PATANOL) 0.1 % ophthalmic solution Place 1 drop into both eyes 2 (two) times daily.   Yes Historical Provider, MD  Vitamin D, Ergocalciferol, (DRISDOL) 50000 UNITS CAPS capsule Take 50,000 Units by mouth every 30 (thirty) days. Take on the 20th of each month   Yes Historical Provider, MD   No Known Allergies  FAMILY HISTORY:  No family history on file. SOCIAL HISTORY:  reports that she has never smoked. She does not have any smokeless tobacco history on file. Her alcohol and drug histories are not on file.  REVIEW OF SYSTEMS:  Unable to obtain secondary to patient condition.  SUBJECTIVE:   VITAL SIGNS: Temp:  [98.1 F (36.7 C)-102.3 F (39.1 C)] 98.1 F (36.7 C) (03/16 0230) Pulse Rate:  [34-119] 39 (03/16 0225) Resp:  [15-39] 24 (03/16 0230) BP: (77-141)/(26-98) 93/35 mmHg (03/16 0230) SpO2:  [81 %-100 %]  86 % (03/16 0225) FiO2 (%):  [40 %-100 %] 40 % (03/16 0100) Weight:  [150 lb (68.04 kg)] 150 lb (68.04 kg) (03/15 2238) HEMODYNAMICS:   VENTILATOR SETTINGS: Vent Mode:  [-] PRVC FiO2 (%):  [40 %-100 %] 40 % Set Rate:  [18 bmp] 18 bmp Vt Set:  [500 mL] 500 mL PEEP:  [5 cmH20] 5 cmH20 Plateau Pressure:  [23 cmH20] 23 cmH20 INTAKE / OUTPUT: Intake/Output     03/15 0701 - 03/16 0700   IV Piggyback 4300   Total Intake(mL/kg) 4300 (63.2)   Urine (mL/kg/hr) 10   Total Output 10   Net +4290          PHYSICAL EXAMINATION: General:  Intubated Elderly F Neuro: Not responsive to stimulation but occasionally moving extremities  HEENT:  Sclera anicteric, conjunctiva pink, ETT present, (-) LAN or JVD Cardiovascular:  RRR, NS1/S2, (-) MRG Lungs:  CTAB Abdomen:  S/NT/ND/(+)BS Musculoskeletal:  (-) C/C/E Skin:  Intact  LABS:  CBC  Recent Labs Lab 11/26/13 2354 11/27/13 0150  WBC 32.4* 23.9*  HGB 16.4* 11.3*  HCT 50.6* 36.3  PLT 204 147*   Coag's  Recent Labs Lab 11/27/13 0103  APTT 24  INR 1.29   BMET  Recent Labs Lab 11/26/13 2354 11/27/13 0150  NA 170*  --   K 5.1  --   CL PENDING  --   CO2 13*  --   BUN PENDING  --   CREATININE 3.38* 2.67*  GLUCOSE 286*  --    Electrolytes  Recent Labs Lab 11/26/13 2354  CALCIUM 10.5   Sepsis Markers  Recent Labs Lab 11/27/13 0002  LATICACIDVEN 3.67*   ABG  Recent Labs Lab 11/27/13 0045  PHART 7.329*  PCO2ART 33.1*  PO2ART 516.0*   Liver Enzymes  Recent Labs Lab 11/26/13 2354  AST 35  ALT 34  ALKPHOS 96  BILITOT 0.6  ALBUMIN 3.3*   Cardiac Enzymes  Recent Labs Lab 11/26/13 2356  TROPONINI 0.58*   Glucose  Recent Labs Lab 11/26/13 2241  GLUCAP 250*    Imaging Dg Cervical Spine 1 View  11/27/2013   CLINICAL DATA:  Central line placement.  EXAM: DG CERVICAL SPINE - 1 VIEW  COMPARISON:  None.  FINDINGS: Right IJ catheter projects retrograde to the level of the base of the skull. Endotracheal tube noted.  IMPRESSION: Right IJ approach central venous catheter tip is at the level of the skullbase.  Critical Value/emergent results were called by telephone at the time of interpretation on 11/27/2013 at 1:52 AM to Dr. Derwood Kaplan , who verbally acknowledged these results.   Electronically Signed   By: Drusilla Kanner M.D.   On: 11/27/2013 01:52   Ct Head Wo Contrast  11/27/2013   CLINICAL DATA:  Altered mental status  EXAM: CT HEAD WITHOUT CONTRAST  TECHNIQUE: Contiguous axial images were  obtained from the base of the skull through the vertex without intravenous contrast.  COMPARISON:  Prior CT from 01/30/2010  FINDINGS: Advanced age-related atrophy with chronic microvascular ischemic disease is present, similar as compared to prior CT from 01/30/2010.  There is no acute intracranial hemorrhage or infarct. No mass lesion or midline shift. Gray-white matter differentiation is well maintained. Ventricles are normal in size without evidence of hydrocephalus. CSF containing spaces are within normal limits. No extra-axial fluid collection. Tiny remote lacunar infarct noted within the left pons.  The calvarium is intact.  Orbital soft tissues are within normal limits. Postsurgical changes noted about both  globes.  Probable retention cyst noted within the right maxillary sinus. Otherwise, the paranasal sinuses and mastoid air cells are well pneumatized and free of fluid.  Scalp soft tissues are unremarkable.  IMPRESSION: 1. No acute intracranial process. 2. Stable atrophy with chronic microvascular ischemic disease.   Electronically Signed   By: Rise Mu M.D.   On: 11/27/2013 01:09   Dg Chest Port 1 View  11/27/2013   CLINICAL DATA:  Central line placement.  EXAM: PORTABLE CHEST - 1 VIEW  COMPARISON:  DG CHEST 1V PORT dated 11/27/2013  FINDINGS: Endotracheal tube noted in stable position. Previously identified central line has been redirected, its tip is projected over the superior vena cava. Persistent left base atelectasis versus infiltrate noted. Stable nodular density versus costochondral calcification right lung base. No pleural effusion or pneumothorax. Heart size is stable.  IMPRESSION: 1. Interim repositioning of central line, its tip is in the superior vena cava.  2. Left basilar atelectasis versus mild infiltrate.   Electronically Signed   By: Maisie Fus  Register   On: 11/27/2013 02:22   Dg Chest Port 1 View  11/27/2013   CLINICAL DATA:  Placement central venous catheter.  EXAM:  PORTABLE CHEST - 1 VIEW  COMPARISON:  DG CHEST 1V PORT dated 11/26/2013  FINDINGS: Endotracheal tube noted with tip 4.5 cm above the carina. Central line on the right is noted looping in the supraclavicular region and coursing superiorly, most likely in the right jugular vein. Mild left basilar atelectasis present. Lungs are otherwise clear. No pneumothorax. Heart size appropriate vascularity normal. No acute bony abnormality identified.  IMPRESSION: 1. Right central line noted looping in the subclavicular region and coursing up the distribution of the right internal jugular vein. Repositioning should be considered. This report was phoned to the patient's caregiver at the time of study. Endotracheal tube noted in good anatomic position. 2. Mild left base subsegmental atelectasis.   Electronically Signed   By: Maisie Fus  Register   On: 11/27/2013 01:54   Dg Chest Port 1 View  11/26/2013   CLINICAL DATA:  Altered mental status, respiratory distress  EXAM: PORTABLE CHEST - 1 VIEW  COMPARISON:  Prior radiograph from 10/03/2008  FINDINGS: The cardiac and mediastinal silhouettes are stable in size and contour, and remain within normal limits.  The lungs are mildly hypoinflated. Minimal linear opacity at the left lung base most likely reflects atelectasis. No airspace consolidation, pleural effusion, or pulmonary edema is identified. There is no pneumothorax.  No acute osseous abnormality identified.  IMPRESSION: Mild left basal atelectasis. No acute cardiopulmonary abnormality identified.   Electronically Signed   By: Rise Mu M.D.   On: 11/26/2013 23:30     CXR: Personally reviewed. Support apparatus in acceptable position. ? Basilar infiltrate.  ASSESSMENT / PLAN:  Patient Active Problem List   Diagnosis Date Noted  . Altered mental status 11/27/2013    Priority: High  . Hypernatremia 11/27/2013    Priority: High  . Metabolic acidosis 11/27/2013    Priority: High  . Hypovolemic shock  11/27/2013    Priority: High  . Infection of urinary tract 11/27/2013    Priority: High  . Sepsis 11/27/2013    Priority: High  . Elevated troponin 11/27/2013    Priority: Medium  . Acute kidney failure, unspecified 03/22/2013    Priority: Medium  . Failure to thrive 11/27/2013    Priority: Low  . Hyperglycemia 11/27/2013    Priority: Low  . Anemia 11/27/2013    Priority: Low  .  Depressive disorder, not elsewhere classified 08/03/2013  . Loss of weight 02/20/2013  . Alzheimer's disease 01/20/2013  . Essential hypertension, benign 01/20/2013  . Osteoporosis, unspecified 01/20/2013  . Allergic rhinitis, cause unspecified 01/20/2013     PULMONARY A: Intubated for airway protection P:   Lung protective ventilation VAP prevention SBTs  CARDIOVASCULAR A:  Hypovolemic Shock: This most likely underlies a majority of her issues. She likely developed a UTI --> AMS --> Dehyration. She is s/p 4 L NS Sepsis: She may be in sepsis as well. She meets SIRS criteria and has a clear infection. It is unclear if the sepsis --> end organ damage or if this is 2/2 hypovolemia. Atrial Fibrillation: Appears to have resolved. No indication for [facility] as was transient in the setting of acute illness. Elevated Troponin: This is likely 2/2 a combination of AKI and demand ischemia. P:  Check CVP, SVO2 Serial Troponins Serial Lactates  RENAL A:   Acute Kidney Injury: Most likely 2/2 hypovolemina. Hypernatremia: This was most likely 2/2 inadequate access to free water. Critical to recheck Na now given fluid resusitation to avoid abrupt decrease.  Metabolic Acidosis: Most likely 2/2 AKI P:   Stat and serial BMPs Strict I/O Urine Lytes  GASTROINTESTINAL A:  No acute issue  HEMATOLOGIC A:  Anemia: Most likely AOCD P:  Monitor  INFECTIOUS A:   UTI ? Pneumonia on X-ray P:   Follow-up cultures Cont Emperic Vanc/Zosyn  ENDOCRINE A:  Hyperglycemia   P:   SSI  NEUROLOGIC A:   AMS: Most  likely 2/2 combination of acute infection and compromised brain. No evidence of acute process on head CT.  P:   Monitor Consider EEG if fails to improve with treatment of UTI  GOC: Patient appears to be ward of the state. SSI worker is Roxana Hires [phone]. I attempted to contact her tonight but only got voicemail. Overall prognosis is terrible and patient has an end-stage disease. Unclear that ICU interventions appropriate.    TODAY'S SUMMARY:   I have personally obtained a history, examined the patient, evaluated laboratory and imaging results, formulated the assessment and plan and placed orders. CRITICAL CARE: The patient is critically ill with multiple organ systems failure and requires high complexity decision making for assessment and support, frequent evaluation and titration of therapies, application of advanced monitoring technologies and extensive interpretation of multiple databases. Critical Care Time devoted to patient care services described in this note is 60 minutes.    Pulmonary and Critical Care Medicine [facility] Pager: [phone]  11/27/2013, 3:14 AM

## 2013-11-27 NOTE — ED Notes (Signed)
Dr Rhunette CroftNanavati given a copy of lactic acid 3.67

## 2013-11-27 NOTE — ED Notes (Signed)
Now attempting R subclavian central line. VSS. No changes.

## 2013-11-27 NOTE — Progress Notes (Signed)
INITIAL NUTRITION ASSESSMENT  DOCUMENTATION CODES Per approved criteria  -Not Applicable   INTERVENTION:  Utilize 68M PEPuP Protocol: initiate TF via OGT with Vital AF 1.2 at 25 ml/h with Prostat 30 ml BID on day 1; on day 2, increase to goal rate of 40 ml/h (960 ml per day) to provide 1352 kcals, 102 gm protein, 779 ml free water daily.  Above TF regimen plus current Propofol rate will provide 1674 kcals (101% of estimated needs).  NUTRITION DIAGNOSIS: Inadequate oral intake related to inability to eat as evidenced by NPO status.   Goal: Intake to meet >90% of estimated nutrition needs.  Monitor:  TF tolerance/adequacy, weight trend, labs, vent status.  Reason for Assessment: MD Consult for TF initiation and management.  80 y.o. female  Admitting Dx: AMS  ASSESSMENT: Patient is an 80 year old female with End-Stage Alzheimer's Disease, ward of state, transferred from nursing home with AMS, hypotension likely 2/2 worsening AMS 2/2 UTI leading to dehydration. Intubated in ED on 3/16.  Nutrition focused physical exam completed.  No muscle or subcutaneous fat depletion noticed. Received MD Consult for TF initiation and management.   Patient is currently intubated on ventilator support.  MV: 9.7 L/min Temp (24hrs), Avg:99.5 F (37.5 C), Min:98.1 F (36.7 C), Max:102.3 F (39.1 C)  Propofol: 12.2 ml/hr providing 322 kcals/day.  Height: Ht Readings from Last 1 Encounters:  11/26/13 5\' 5"  (1.651 m)    Weight: Wt Readings from Last 1 Encounters:  11/27/13 139 lb 15.9 oz (63.5 kg)    Ideal Body Weight: 56.8 kg  % Ideal Body Weight: 112%  Wt Readings from Last 10 Encounters:  11/27/13 139 lb 15.9 oz (63.5 kg)    Usual Body Weight: unknown  % Usual Body Weight: N/A  BMI:  Body mass index is 23.3 kg/(m^2).  Estimated Nutritional Needs: Kcal: 1650 Protein: 85-100 gm Fluid: 1.7-1.9 L  Skin: right foot open wound  Diet Order: NPO  EDUCATION NEEDS: -No  education needs identified at this time   Intake/Output Summary (Last 24 hours) at 11/27/13 1520 Last data filed at 11/27/13 1500  Gross per 24 hour  Intake 6092.16 ml  Output    935 ml  Net 5157.16 ml    Last BM: None documented since admission   Labs:   Recent Labs Lab 11/27/13 0328 11/27/13 0728 11/27/13 1128  NA 166* 166* 168*  K 3.9 3.5* 3.6*  CL >130* >130* >130*  CO2 14* 14* 13*  BUN 99* 93* 90*  CREATININE 2.71* 2.60* 2.60*  CALCIUM 7.5* 7.5* 7.9*  GLUCOSE 386* 348* 149*    CBG (last 3)   Recent Labs  11/26/13 2241 11/27/13 0407 11/27/13 0621  GLUCAP 250* 263* 294*    Scheduled Meds: . chlorhexidine  15 mL Mouth/Throat BID  . docusate  100 mg Per Tube Daily  . escitalopram  20 mg Oral Daily  . feeding supplement (VITAL HIGH PROTEIN)  1,000 mL Per Tube Q24H  . heparin  5,000 Units Subcutaneous 3 times per day  . hydrocortisone sodium succinate  50 mg Intravenous Q6H  . loratadine  10 mg Oral Daily  . multivitamin with minerals  1 tablet Oral Daily  . olopatadine  1 drop Both Eyes BID  . pantoprazole (PROTONIX) IV  40 mg Intravenous Q24H  . piperacillin-tazobactam (ZOSYN)  IV  2.25 g Intravenous 4 times per day  . [START ON 11/29/2013] vancomycin  1,000 mg Intravenous Q48H  . [START ON 12/01/2013] Vitamin D (Ergocalciferol)  50,000 Units Oral Q30 days    Continuous Infusions: . sodium chloride 75 mL/hr (11/27/13 1055)  . insulin (NOVOLIN-R) infusion Stopped (11/27/13 1433)  . norepinephrine (LEVOPHED) Adult infusion 20 mcg/min (11/27/13 1510)  . propofol 30 mcg/kg/min (11/27/13 1059)    Past Medical History  Diagnosis Date  . Alzheimer's disease 01/20/2013  . Essential hypertension, benign 01/20/2013  . Osteoporosis, unspecified 01/20/2013  . Allergic rhinitis, cause unspecified 01/20/2013  . Femur fracture     Remote, Details Unknown  . Facial fracture     Remote, details unknown    History reviewed. No pertinent past surgical  history.   Joaquin Courts, RD, LDN, CNSC Pager [phone] After Hours Pager [phone]

## 2013-11-27 NOTE — ED Notes (Addendum)
Drs. Marcha SoldersBrtalik and [facility], [facility] line [facility] continues. Pt calm, NAD, breathing with vent, no movement of extremeties.

## 2013-11-27 NOTE — ED Notes (Signed)
CT complete. Back to Trauma room C, [facility]

## 2013-11-27 NOTE — ED Notes (Signed)
Critical labs reported to Drs Rhunette CroftNanavati (troponin & Na) and [facility].

## 2013-11-27 NOTE — ED Notes (Signed)
[facility], preparing for a-line placement. Levophed started.

## 2013-11-27 NOTE — ED Notes (Signed)
Drs. Rhunette CroftNanavati and March[facility]rtalik [facility] for [facility] line [facility]. Dr. Marcha SoldersBrtalik unsuccessful with line. Dr. Rhunette CroftNanavati re-attempting R [place].  VSS. No changes. NAD, calm.

## 2013-11-27 NOTE — ED Notes (Addendum)
ABG obtained. 1st BC obtained with ABG, 4cc in blue bottle after zosyn started. Dr. Marcha SoldersBrtalik placing central line, R IJ.

## 2013-11-27 NOTE — Procedures (Signed)
Arterial Catheter Insertion Procedure Note Gerline LegacyKatherine M Bebo [id number] 03-Apr-1923  Procedure: Insertion of Arterial Catheter  Indications: Blood pressure monitoring and Frequent blood sampling  Procedure Details Consent: Unable to obtain consent because of altered level of consciousness. Time Out: Verified patient identification, verified procedure, site/side was marked, verified correct patient position, special equipment/implants available, medications/allergies/relevent history reviewed, required imaging and test results available.  Performed  Maximum sterile technique was used including antiseptics, cap, gloves, gown, hand hygiene, mask and sheet. Skin prep: Chlorhexidine; local anesthetic administered 20 gauge catheter was inserted into right radial artery using the Seldinger technique.  Evaluation Blood flow good; BP tracing good. Complications: No apparent complications.   Gaetano HawthorneBrooker, Bryor Rami 11/27/2013

## 2013-11-27 NOTE — Progress Notes (Signed)
ANTIBIOTIC CONSULT NOTE - INITIAL  Pharmacy Consult for Vancocin and Zosyn Indication: rule out pneumonia, rule out sepsis and UTI  No Known Allergies  Patient Measurements: Height: 5\' 5"  (165.1 cm) Weight: 139 lb 15.9 oz (63.5 kg) IBW/kg (Calculated) : 57  Vital Signs: Temp: 98.1 F (36.7 C) (03/16 0230) Temp src: Rectal (03/15 2317) BP: 93/35 mmHg (03/16 0230) Pulse Rate: 39 (03/16 0225)  Labs:  Recent Labs  11/26/13 2354 11/27/13 0150  WBC 32.4* 23.9*  HGB 16.4* 11.3*  PLT 204 147*  CREATININE 3.38* 2.67*   Estimated Creatinine Clearance: 13.1 ml/min (by C-G formula based on Cr of 2.67).   Microbiology: No results found for this or any previous visit (from the past 720 hour(s)).  Medical History: Past Medical History  Diagnosis Date  . Alzheimer's disease 01/20/2013  . Essential hypertension, benign 01/20/2013  . Osteoporosis, unspecified 01/20/2013  . Allergic rhinitis, cause unspecified 01/20/2013  . Femur fracture     Remote, Details Unknown  . Facial fracture     Remote, details unknown    Medications:  Prescriptions prior to admission  Medication Sig Dispense Refill  . amLODipine (NORVASC) 10 MG tablet Take 10 mg by mouth daily.      Marland Kitchen. docusate sodium (COLACE) 100 MG capsule Take 100 mg by mouth daily.      Marland Kitchen. escitalopram (LEXAPRO) 20 MG tablet Take 20 mg by mouth daily.      Marland Kitchen. lactose free nutrition (BOOST) LIQD Take 237 mLs by mouth 3 (three) times daily with meals.      Marland Kitchen. loratadine (CLARITIN) 10 MG tablet Take 10 mg by mouth daily.      . metoprolol tartrate (LOPRESSOR) 25 MG tablet Take 25 mg by mouth daily.      . Multiple Vitamin (MULTIVITAMIN WITH MINERALS) TABS tablet Take 1 tablet by mouth daily.      Marland Kitchen. olopatadine (PATANOL) 0.1 % ophthalmic solution Place 1 drop into both eyes 2 (two) times daily.      . Vitamin D, Ergocalciferol, (DRISDOL) 50000 UNITS CAPS capsule Take 50,000 Units by mouth every 30 (thirty) days. Take on the 20th of each month        Scheduled:  . docusate sodium  100 mg Oral Daily  . escitalopram  20 mg Oral Daily  . fentaNYL      . heparin  5,000 Units Subcutaneous 3 times per day  . insulin aspart  1-3 Units Subcutaneous 6 times per day  . lidocaine (cardiac) 100 mg/395ml      . loratadine  10 mg Oral Daily  . LORazepam      . multivitamin with minerals  1 tablet Oral Daily  . olopatadine  1 drop Both Eyes BID  . pantoprazole (PROTONIX) IV  40 mg Intravenous Q24H  . rocuronium      . [START ON 12/01/2013] Vitamin D (Ergocalciferol)  50,000 Units Oral Q30 days    Assessment: 80yo female presents from NH w/ AMS, hypotension, and dehydration, found w/ AKI likely d/t hypovolemia, hyperkalemia likely d/t low free water, SIRS criteria, CXR reveals possible PNA, to begin broad-spectrum ABX.  Goal of Therapy:  Vancomycin trough level 15-20 mcg/ml  Plan:  Rec'd vanc 1g and Zosyn 4.5g IV in ED; will continue with vancomycin 1000mg  IV Q48H and Zosyn 2.25g IV Q6H and monitor CBC, Cx, CrCl, levels prn.  Vernard GamblesVeronda Sakinah Rosamond, PharmD, BCPS  11/27/2013,3:31 AM

## 2013-11-27 NOTE — Progress Notes (Addendum)
CRITICAL VALUE ALERT  Critical value received:  Na 166, Cl >130  Date of notification:  11/27/2013  Time of notification:  05:35  Critical value read back:yes  Nurse who received alert:  Dustin Flockhris Hayes RN  MD notified (1st page):  Zubelivitsky MD  Time of first page:  05:35  MD notified (2nd page):  Time of second page:  Responding MD:  De BurrsZubelivitsky MD  Time MD responded:  05:35

## 2013-11-27 NOTE — ED Notes (Signed)
Pt raising L arm, biting OG tube, bite block placed, sedation started.

## 2013-11-27 NOTE — Progress Notes (Signed)
eLink Physician-Brief Progress Note Patient Name: KatherineGerline Legacy M Lamartina DOB: 08-19-1923 MRN: [id number]  Date of Service  11/27/2013   HPI/Events of Note   tachyrythmia  eICU Interventions  1/2 NS bolus Tylenol for fever   Intervention Category Intermediate Interventions: Hypovolemia - evaluation and management  ALVA,RAKESH V. 11/27/2013, 5:01 PM

## 2013-11-27 NOTE — ED Provider Notes (Signed)
CSN: [id number]     Arrival date & time 11/26/13  2225 History   First MD Initiated Contact with Patient 11/26/13 2303     Chief Complaint  Patient presents with  . Altered Mental Status     (Consider location/radiation/quality/duration/timing/severity/associated sxs/prior Treatment) HPI Comments: Pt sent from nursing home d/t unresponsiveness. History limited d/t patient's condition. Level 5 caveat d/t altered mental status.  Patient is a 80 y.o. female presenting with altered mental status. The history is provided by the EMS personnel and medical records. The history is limited by the condition of the patient.  Altered Mental Status Presenting symptoms: confusion, lethargy and unresponsiveness   Severity:  Severe Most recent episode:  Today Episode history:  Unable to specify Timing:  Constant Chronicity:  New Context: dementia and nursing home resident   Context: not head injury   Associated symptoms: no fever, no rash, no seizures and no vomiting     No past medical history on file. No past surgical history on file. No family history on file. History  Substance Use Topics  . Smoking status: Never Smoker   . Smokeless tobacco: Not on file  . Alcohol Use: Not on file   OB History   Grav Para Term Preterm Abortions TAB SAB Ect Mult Living                 Review of Systems  Unable to perform ROS: Mental status change  Constitutional: Negative for fever.  Gastrointestinal: Negative for vomiting.  Skin: Negative for rash.  Neurological: Negative for seizures.  Psychiatric/Behavioral: Positive for confusion.      Allergies  Review of patient's allergies indicates no known allergies.  Home Medications   Current Outpatient Rx  Name  Route  Sig  Dispense  Refill  . amLODipine (NORVASC) 10 MG tablet   Oral   Take 10 mg by mouth daily.         . cefTRIAXone (ROCEPHIN) 2 G injection   Intramuscular   Inject 2 g into the muscle daily. For 6 days         .  docusate sodium (COLACE) 100 MG capsule   Oral   Take 100 mg by mouth daily.         Marland Kitchen. escitalopram (LEXAPRO) 20 MG tablet   Oral   Take 20 mg by mouth daily.         Marland Kitchen. lactose free nutrition (BOOST) LIQD   Oral   Take 237 mLs by mouth 3 (three) times daily with meals.         Marland Kitchen. loratadine (CLARITIN) 10 MG tablet   Oral   Take 10 mg by mouth daily.         . memantine (NAMENDA) 10 MG tablet   Oral   Take 10 mg by mouth daily.         . metoprolol tartrate (LOPRESSOR) 25 MG tablet   Oral   Take 25 mg by mouth daily.         . Multiple Vitamin (MULTIVITAMIN WITH MINERALS) TABS tablet   Oral   Take 1 tablet by mouth daily.         Marland Kitchen. olopatadine (PATANOL) 0.1 % ophthalmic solution   Both Eyes   Place 1 drop into both eyes 2 (two) times daily.         . Vitamin D, Ergocalciferol, (DRISDOL) 50000 UNITS CAPS capsule   Oral   Take 50,000 Units by mouth every 30 (thirty) days.  Take on the 20th of each month          BP 98/42  Pulse 71  Temp(Src) 99.5 F (37.5 C) (Rectal)  Resp 22  Ht 5\' 5"  (1.651 m)  Wt 150 lb (68.04 kg)  BMI 24.96 kg/m2  SpO2 82% Physical Exam  Nursing note and vitals reviewed. Constitutional: She appears well-developed and well-nourished. She appears distressed.  Obtunded, GCS5, elderly chronically ill appearing female  HENT:  Head: Normocephalic and atraumatic.  Mouth/Throat: Oropharyngeal exudate present.  Pharynx with large amount of yellow secretions  Eyes: Conjunctivae are normal. Pupils are equal, round, and reactive to light. Right eye exhibits no discharge. Left eye exhibits no discharge. No scleral icterus.  Neck: Neck supple.  Cardiovascular: Intact distal pulses.  Exam reveals no gallop and no friction rub.   No murmur heard. irreg irreg tachy  Pulmonary/Chest: Effort normal. No respiratory distress. She has no wheezes. She has no rales.  Breathing on own, lungs coarse b/l  Abdominal: Soft. She exhibits no distension  and no mass. There is no tenderness (no pain response).  Musculoskeletal: Normal range of motion.  Neurological: She exhibits normal muscle tone.  Obtunded, GCS5, intact gag, no gross neuro deficits  Skin: No rash noted. She is not diaphoretic.    ED Course  INTUBATION Date/Time: 11/27/2013 12:21 AM Performed by: Pilar Jarvis Authorized by: Derwood Kaplan Consent: The procedure was performed in an emergent situation. Required items: required blood products, implants, devices, and special equipment available Patient identity confirmed: anonymous protocol, patient vented/unresponsive Time out: Immediately prior to procedure a "time out" was called to verify the correct patient, procedure, equipment, support staff and site/side marked as required. Indications: respiratory failure Intubation method: video-assisted Patient status: paralyzed (RSI) Preoxygenation: nonrebreather mask Pretreatment medications: lidocaine Sedatives: etomidate Paralytic: succinylcholine Laryngoscope size: Mac 3 Tube size: 7.5 mm Tube type: cuffed Number of attempts: 2 Ventilation between attempts: BVM Cricoid pressure: yes Cords visualized: yes Post-procedure assessment: chest rise and ETCO2 monitor Breath sounds: equal and absent over the epigastrium Cuff inflated: yes ETT to lip: 21 cm Tube secured with: ETT holder Chest x-ray interpreted by me and radiologist. Chest x-ray findings: endotracheal tube in appropriate position Patient tolerance: Patient tolerated the procedure well with no immediate complications. CommentsRhunette Croft MD, attending, present at bedside for procedure  CENTRAL LINE Date/Time: 11/27/2013 1:59 AM Performed by: Pilar Jarvis Authorized by: Derwood Kaplan Consent: The procedure was performed in an emergent situation. Site marked: the operative site was marked Imaging studies: imaging studies available Required items: required blood products, implants, devices, and special  equipment available Patient identity confirmed: anonymous protocol, patient vented/unresponsive Time out: Immediately prior to procedure a "time out" was called to verify the correct patient, procedure, equipment, support staff and site/side marked as required. Indications: vascular access Patient sedated: yes Sedatives: lorazepam Analgesia: fentanyl Preparation: skin prepped with ChloraPrep Skin prep agent dried: skin prep agent completely dried prior to procedure Sterile barriers: all five maximum sterile barriers used - cap, mask, sterile gown, sterile gloves, and large sterile sheet Hand hygiene: hand hygiene performed prior to central venous catheter insertion Location details: right subclavian Site selection rationale: First attempted R IJ but patient severely volume depleted that would not obtain flash even when visibly in vessel Patient position: reverse Trendelenburg Catheter type: triple lumen Pre-procedure: landmarks identified Ultrasound guidance: no Number of attempts: 4 Successful placement: yes Post-procedure: line sutured and dressing applied Assessment: blood return through all ports,  placement verified by x-ray and no  pneumothorax on x-ray Complications: local hematoma Patient tolerance: Patient tolerated the procedure well with no immediate complications. Comments: Initially tried R IJ but unable to obtain flash with 2 attempts even when visualizing needle tip in vessel on ultrasound. Converted to R subclavian with flash and placement but difficulty removing wire. XR intraprocedure shows wire went wrong direction so whole apparatus removed. 2 further attempts by attending with good flash and placement and XR shows proper placement of CVC.   (including critical care time) Labs Review Labs Reviewed  CBC - Abnormal; Notable for the following:    WBC 32.4 (*)    RBC 5.16 (*)    Hemoglobin 16.4 (*)    HCT 50.6 (*)    All other components within normal limits   COMPREHENSIVE METABOLIC PANEL - Abnormal; Notable for the following:    Sodium 170 (*)    CO2 13 (*)    Glucose, Bld 286 (*)    Creatinine, Ser 3.38 (*)    Albumin 3.3 (*)    GFR calc non Af Amer 11 (*)    GFR calc Af Amer 13 (*)    All other components within normal limits  URINALYSIS, ROUTINE W REFLEX MICROSCOPIC - Abnormal; Notable for the following:    Color, Urine AMBER (*)    APPearance TURBID (*)    pH 8.5 (*)    Hgb urine dipstick LARGE (*)    Bilirubin Urine SMALL (*)    Ketones, ur 15 (*)    Protein, ur >300 (*)    Leukocytes, UA LARGE (*)    All other components within normal limits  TROPONIN I - Abnormal; Notable for the following:    Troponin I 0.58 (*)    All other components within normal limits  URINE MICROSCOPIC-ADD ON - Abnormal; Notable for the following:    Squamous Epithelial / LPF FEW (*)    Bacteria, UA MANY (*)    All other components within normal limits  CBG MONITORING, ED - Abnormal; Notable for the following:    Glucose-Capillary 250 (*)    All other components within normal limits  I-STAT CG4 LACTIC ACID, ED - Abnormal; Notable for the following:    Lactic Acid, Venous 3.67 (*)    All other components within normal limits  I-STAT ARTERIAL BLOOD GAS, ED - Abnormal; Notable for the following:    pH, Arterial 7.329 (*)    pCO2 arterial 33.1 (*)    pO2, Arterial 516.0 (*)    Bicarbonate 17.0 (*)    Acid-base deficit 7.0 (*)    All other components within normal limits  URINE CULTURE  CULTURE, BLOOD (ROUTINE X 2)  CULTURE, BLOOD (ROUTINE X 2)  CULTURE, EXPECTORATED SPUTUM-ASSESSMENT  CULTURE, BLOOD (ROUTINE X 2)  APTT  PROTIME-INR  CBC  CREATININE, SERUM  CBG MONITORING, ED  I-STAT VENOUS BLOOD GAS, ED   Imaging Review Dg Cervical Spine 1 View  11/27/2013   CLINICAL DATA:  Central line placement.  EXAM: DG CERVICAL SPINE - 1 VIEW  COMPARISON:  None.  FINDINGS: Right IJ catheter projects retrograde to the level of the base of the skull.  Endotracheal tube noted.  IMPRESSION: Right IJ approach central venous catheter tip is at the level of the skullbase.  Critical Value/emergent results were called by telephone at the time of interpretation on 11/27/2013 at 1:52 AM to Dr. Derwood Kaplan , who verbally acknowledged these results.   Electronically Signed   By: Drusilla Kanner M.D.   On: 11/27/2013  01:52   Ct Head Wo Contrast  11/27/2013   CLINICAL DATA:  Altered mental status  EXAM: CT HEAD WITHOUT CONTRAST  TECHNIQUE: Contiguous axial images were obtained from the base of the skull through the vertex without intravenous contrast.  COMPARISON:  Prior CT from 01/30/2010  FINDINGS: Advanced age-related atrophy with chronic microvascular ischemic disease is present, similar as compared to prior CT from 01/30/2010.  There is no acute intracranial hemorrhage or infarct. No mass lesion or midline shift. Gray-white matter differentiation is well maintained. Ventricles are normal in size without evidence of hydrocephalus. CSF containing spaces are within normal limits. No extra-axial fluid collection. Tiny remote lacunar infarct noted within the left pons.  The calvarium is intact.  Orbital soft tissues are within normal limits. Postsurgical changes noted about both globes.  Probable retention cyst noted within the right maxillary sinus. Otherwise, the paranasal sinuses and mastoid air cells are well pneumatized and free of fluid.  Scalp soft tissues are unremarkable.  IMPRESSION: 1. No acute intracranial process. 2. Stable atrophy with chronic microvascular ischemic disease.   Electronically Signed   By: Rise Mu M.D.   On: 11/27/2013 01:09   Dg Chest Port 1 View  11/27/2013   CLINICAL DATA:  Placement central venous catheter.  EXAM: PORTABLE CHEST - 1 VIEW  COMPARISON:  DG CHEST 1V PORT dated 11/26/2013  FINDINGS: Endotracheal tube noted with tip 4.5 cm above the carina. Central line on the right is noted looping in the supraclavicular  region and coursing superiorly, most likely in the right jugular vein. Mild left basilar atelectasis present. Lungs are otherwise clear. No pneumothorax. Heart size appropriate vascularity normal. No acute bony abnormality identified.  IMPRESSION: 1. Right central line noted looping in the subclavicular region and coursing up the distribution of the right internal jugular vein. Repositioning should be considered. This report was phoned to the patient's caregiver at the time of study. Endotracheal tube noted in good anatomic position. 2. Mild left base subsegmental atelectasis.   Electronically Signed   By: Maisie Fus  Register   On: 11/27/2013 01:54   Dg Chest Port 1 View  11/26/2013   CLINICAL DATA:  Altered mental status, respiratory distress  EXAM: PORTABLE CHEST - 1 VIEW  COMPARISON:  Prior radiograph from 10/03/2008  FINDINGS: The cardiac and mediastinal silhouettes are stable in size and contour, and remain within normal limits.  The lungs are mildly hypoinflated. Minimal linear opacity at the left lung base most likely reflects atelectasis. No airspace consolidation, pleural effusion, or pulmonary edema is identified. There is no pneumothorax.  No acute osseous abnormality identified.  IMPRESSION: Mild left basal atelectasis. No acute cardiopulmonary abnormality identified.   Electronically Signed   By: Rise Mu M.D.   On: 11/26/2013 23:30     EKG Interpretation   Date/Time:  Sunday November 26 2013 22:37:41 EDT Ventricular Rate:  74 PR Interval:    QRS Duration: 83 QT Interval:  322 QTC Calculation: 357 R Axis:   74 Text Interpretation:  Undetermined rhythm narrow complex Repol abnrm  suggests ischemia, diffuse leads Since last tracing Non-specific ST-t  changes Confirmed by Bernette Mayers  MD, Leonette Most [phone]) on 11/26/2013 10:44:21 PM      MDM   MDM: 80 y.o WF from nursing home for unresponsiveness. Pt GCS 5 on arrival, no obtainable history, family not available. Dec LOC from SNF. Here  febrile, mildly tachy irreg, BP normotensive but appears dry. Code sepsis called. Intubated for airway protection. Difficult with IV access  but small IV obtained in L hand. After intubated, head CT and XR obtained, and central access obtained as above. Given Vanc and Zosyn for antibiotics, labs, cultures sent. Pt given multiple L of NS and maintaining pressures. Ativan and Fentanyl first for sedation, then switched to Propofol. Critical care consulted, will admit to ICU. Care of case d/w my attending.   Final diagnoses:  Altered mental status  Severe sepsis    Admit to ICU   Pilar Jarvis, MD 11/27/13 [phone]

## 2013-11-27 NOTE — ED Notes (Signed)
Preparing for abg and central line, CT ready to CT, VSS. No changes.

## 2013-11-27 NOTE — ED Notes (Signed)
[facility] Dorthula RueBollanger.  [facility]

## 2013-11-27 NOTE — ED Provider Notes (Addendum)
I performed a history and physical examination of  Tiffany Mckinney and discussed her management with the resident physician. I agree with the history, physical, assessment, and plan of care, with the following exceptions: None I was present for critical parts of the following procedures: Intubation and Central line placement  Time Spent in Critical Care of the patient: 45 minutes  Time spent in discussions with the patient and family: 20 minutes    EKG Interpretation   Date/Time:  Sunday November 26 2013 22:37:41 EDT Ventricular Rate:  74 PR Interval:    QRS Duration: 83 QT Interval:  322 QTC Calculation: 357 R Axis:   74 Text Interpretation:  Undetermined rhythm narrow complex Repol abnrm  suggests ischemia, diffuse leads Since last tracing Non-specific ST-t  changes Confirmed by Bernette MayersSHELDON  MD, Leonette MostHARLES [phone]) on 11/26/2013 10:44:21 PM       CRITICAL CARE Performed by: Derwood KaplanNanavati, Dory Verdun   Total critical care time: 45 minutes  Critical care time was exclusive of separately billable procedures and treating other patients.  Critical care was necessary to treat or prevent imminent or life-threatening deterioration.  Critical care was time spent personally by me on the following activities: development of treatment plan with patient and/or surrogate as well as nursing, discussions with consultants, evaluation of patient's response to treatment, examination of patient, obtaining history from patient or surrogate, ordering and performing treatments and interventions, ordering and review of laboratory studies, ordering and review of radiographic studies, pulse oximetry and re-evaluation of patient's condition.  Tiffany Mckinney comes in with cc of AMS from a nursing home. During my evaluation, Tiffany Mckinney was tachycardic, tachypnic, and was altered. She had no gag reflex. GCS was e/v/m = 1/1/4 = 6. Intubated for airway protection as she is full code, family not at bedside.  Tiffany Mckinney is full code.  Code sepsis  was promptly called - and ivf and antibiotics ordered.  During the central line, we had a complication, where the guide wire kinked and went cephalad. The wire appeared to be giving us resistance, when we were trying to pull it out under the catheter. The entire circuit was pulled out, and wire came out easily. We placed a central line again right after, and had no complications.  DDx includes: ICH Stroke ACS Sepsis syndrome Infection - UTI/Pneumonia Electrolyte abnormality Metabolic disorders including thyroid disorders, adrenal insufficiency Acute anemia Cancer of unknown origin   CCM to admit.   Diagnoses:  Severe Sepsis Altered mental status Hypernatremia   Derwood KaplanAnkit Rasheen Schewe, MD 11/27/13 [id number]  Derwood KaplanAnkit Dariyah Garduno, MD 11/27/13 [phone]  Derwood KaplanAnkit Trelon Plush, MD 11/27/13 [id number]

## 2013-11-27 NOTE — Procedures (Signed)
Arterial Catheter Insertion Procedure Note Gerline LegacyKatherine M Bordwell [id number] [birth year]  Procedure: Insertion of Arterial Catheter  Indications: Blood pressure monitoring and Frequent blood sampling  Procedure Details Consent: Risks of procedure as well as the alternatives and risks of each were explained to the (patient/caregiver).  Consent for procedure obtained. Time Out: Verified patient identification, verified procedure, site/side was marked, verified correct patient position, special equipment/implants available, medications/allergies/relevent history reviewed, required imaging and test results available.  Performed  Maximum sterile technique was used including antiseptics, gloves, gown, hand hygiene, mask and sheet. Skin prep: Chlorhexidine; local anesthetic administered 20 gauge catheter was inserted into left radial artery using the Seldinger technique.  Evaluation Blood flow good; BP tracing good. Complications: No apparent complications.   Clearnce SorrelCoro, Kristyana Notte [facility] 11/27/2013

## 2013-11-27 NOTE — ED Notes (Addendum)
Aline pressure 127/65 (88), levophed decreased.

## 2013-11-27 NOTE — H&P (Signed)
PULMONARY / CRITICAL CARE MEDICINE   Name: Tiffany Mckinney MRN: [id number] DOB: 1923-03-28    ADMISSION DATE:  11/26/2013  PRIMARY SERVICE: PCCM  CHIEF COMPLAINT:  AMS  BRIEF PATIENT DESCRIPTION: [age] F with End-Stage Alzheimer's Disease, ward of state, transferred from nursing home with AMS, hypotension likely 2/2 worsening AMS 2/2 UTI leading to dehydration.  SIGNIFICANT EVENTS / STUDIES:  Intubated in ED 3/16 R [place] CVC placed by ED knotted and replaced 3/16  LINES / TUBES: ETT 3/16 R [place] CVC 3/16 R Radial Arterial Line 3/16 Foley Catheter 3/16  CULTURES: Blood Culture x 2 3/16 Urine Culture  3/16 Sputum Culture 3/16  ANTIBIOTICS: Vanc 3/16- Zosyn 3/16-  SUBJECTIVE: Na remains high  VITAL SIGNS: Temp:  [98.1 F (36.7 C)-102.3 F (39.1 C)] 100 F (37.8 C) (03/16 1000) Pulse Rate:  [25-119] 105 (03/16 1135) Resp:  [15-39] 24 (03/16 1135) BP: (77-141)/(26-98) 116/59 mmHg (03/16 1000) SpO2:  [81 %-100 %] 100 % (03/16 1135) Arterial Line BP: (85-109)/(47-80) 101/48 mmHg (03/16 1000) FiO2 (%):  [40 %-100 %] 40 % (03/16 1135) Weight:  [63.5 kg (139 lb 15.9 oz)-68.04 kg (150 lb)] 63.5 kg (139 lb 15.9 oz) (03/16 0317) HEMODYNAMICS: CVP:  [8 mmHg-27 mmHg] 8 mmHg VENTILATOR SETTINGS: Vent Mode:  [-] PRVC FiO2 (%):  [40 %-100 %] 40 % Set Rate:  [18 bmp] 18 bmp Vt Set:  [400 mL-500 mL] 400 mL PEEP:  [5 cmH20] 5 cmH20 Pressure Support:  [8 cmH20] 8 cmH20 Plateau Pressure:  [14 cmH20-23 cmH20] 14 cmH20 INTAKE / OUTPUT: Intake/Output     03/15 0701 - 03/16 0700 03/16 0701 - 03/17 0700   I.V. (mL/kg) 168.5 (2.7) 152.9 (2.4)   IV Piggyback 4350 50   Total Intake(mL/kg) 4518.5 (71.2) 202.9 (3.2)   Urine (mL/kg/hr) 550 290 (0.9)   Total Output 550 290   Net +3968.5 -87.1          PHYSICAL EXAMINATION: General:  Intubated Elderly F Neuro: Not responsive to stimulation but occasionally moving extremities  HEENT:  per Cardiovascular:  RRR, NS1/S2, (-) MRG Lungs:   CTAB Abdomen:  S/NT/ND/(+)BS Musculoskeletal:  (-) C/C/E Skin:  Intact  LABS:  CBC  Recent Labs Lab 11/26/13 2354 11/27/13 0150  WBC 32.4* 23.9*  HGB 16.4* 11.3*  HCT 50.6* 36.3  PLT 204 147*   Coag's  Recent Labs Lab 11/27/13 0103  APTT 24  INR 1.29   BMET  Recent Labs Lab 11/26/13 2354 11/27/13 0150 11/27/13 0328 11/27/13 0728  NA 170*  --  166* 166*  K 5.1  --  3.9 3.5*  CL >130*  --  >130* >130*  CO2 13*  --  14* 14*  BUN 103*  --  99* 93*  CREATININE 3.38* 2.67* 2.71* 2.60*  GLUCOSE 286*  --  386* 348*   Electrolytes  Recent Labs Lab 11/26/13 2354 11/27/13 0328 11/27/13 0728  CALCIUM 10.5 7.5* 7.5*   Sepsis Markers  Recent Labs Lab 11/27/13 0002 11/27/13 0400  LATICACIDVEN 3.67* 1.9   ABG  Recent Labs Lab 11/27/13 0045 11/27/13 0320  PHART 7.329* 7.292*  PCO2ART 33.1* 29.7*  PO2ART 516.0* 308.0*   Liver Enzymes  Recent Labs Lab 11/26/13 2354  AST 35  ALT 34  ALKPHOS 96  BILITOT 0.6  ALBUMIN 3.3*   Cardiac Enzymes  Recent Labs Lab 11/26/13 2356 11/27/13 0327 11/27/13 0728  TROPONINI 0.58* 0.58* 0.48*   Glucose  Recent Labs Lab 11/26/13 2241 11/27/13 0407  GLUCAP 250* 263*  Imaging Dg Cervical Spine 1 View  11/27/2013   CLINICAL DATA:  Central line placement.  EXAM: DG CERVICAL SPINE - 1 VIEW  COMPARISON:  None.  FINDINGS: Right IJ catheter projects retrograde to the level of the base of the skull. Endotracheal tube noted.  IMPRESSION: Right IJ approach central venous catheter tip is at the level of the skullbase.  Critical Value/emergent results were called by telephone at the time of interpretation on 11/27/2013 at 1:52 AM to Dr. Derwood Kaplan , who verbally acknowledged these results.   Electronically Signed   By: Drusilla Kanner M.D.   On: 11/27/2013 01:52   Ct Head Wo Contrast  11/27/2013   CLINICAL DATA:  Altered mental status  EXAM: CT HEAD WITHOUT CONTRAST  TECHNIQUE: Contiguous axial images were  obtained from the base of the skull through the vertex without intravenous contrast.  COMPARISON:  Prior CT from 01/30/2010  FINDINGS: Advanced age-related atrophy with chronic microvascular ischemic disease is present, similar as compared to prior CT from 01/30/2010.  There is no acute intracranial hemorrhage or infarct. No mass lesion or midline shift. Gray-white matter differentiation is well maintained. Ventricles are normal in size without evidence of hydrocephalus. CSF containing spaces are within normal limits. No extra-axial fluid collection. Tiny remote lacunar infarct noted within the left pons.  The calvarium is intact.  Orbital soft tissues are within normal limits. Postsurgical changes noted about both globes.  Probable retention cyst noted within the right maxillary sinus. Otherwise, the paranasal sinuses and mastoid air cells are well pneumatized and free of fluid.  Scalp soft tissues are unremarkable.  IMPRESSION: 1. No acute intracranial process. 2. Stable atrophy with chronic microvascular ischemic disease.   Electronically Signed   By: Rise Mu M.D.   On: 11/27/2013 01:09   Dg Chest Port 1 View  11/27/2013   CLINICAL DATA:  Central line placement.  EXAM: PORTABLE CHEST - 1 VIEW  COMPARISON:  DG CHEST 1V PORT dated 11/27/2013  FINDINGS: Endotracheal tube noted in stable position. Previously identified central line has been redirected, its tip is projected over the superior vena cava. Persistent left base atelectasis versus infiltrate noted. Stable nodular density versus costochondral calcification right lung base. No pleural effusion or pneumothorax. Heart size is stable.  IMPRESSION: 1. Interim repositioning of central line, its tip is in the superior vena cava.  2. Left basilar atelectasis versus mild infiltrate.   Electronically Signed   By: Maisie Fus  Register   On: 11/27/2013 02:22   Dg Chest Port 1 View  11/27/2013   CLINICAL DATA:  Placement central venous catheter.  EXAM:  PORTABLE CHEST - 1 VIEW  COMPARISON:  DG CHEST 1V PORT dated 11/26/2013  FINDINGS: Endotracheal tube noted with tip 4.5 cm above the carina. Central line on the right is noted looping in the supraclavicular region and coursing superiorly, most likely in the right jugular vein. Mild left basilar atelectasis present. Lungs are otherwise clear. No pneumothorax. Heart size appropriate vascularity normal. No acute bony abnormality identified.  IMPRESSION: 1. Right central line noted looping in the subclavicular region and coursing up the distribution of the right internal jugular vein. Repositioning should be considered. This report was phoned to the patient's caregiver at the time of study. Endotracheal tube noted in good anatomic position. 2. Mild left base subsegmental atelectasis.   Electronically Signed   By: Maisie Fus  Register   On: 11/27/2013 01:54   Dg Chest Port 1 View  11/26/2013   CLINICAL DATA:  Altered mental status, respiratory distress  EXAM: PORTABLE CHEST - 1 VIEW  COMPARISON:  Prior radiograph from 10/03/2008  FINDINGS: The cardiac and mediastinal silhouettes are stable in size and contour, and remain within normal limits.  The lungs are mildly hypoinflated. Minimal linear opacity at the left lung base most likely reflects atelectasis. No airspace consolidation, pleural effusion, or pulmonary edema is identified. There is no pneumothorax.  No acute osseous abnormality identified.  IMPRESSION: Mild left basal atelectasis. No acute cardiopulmonary abnormality identified.   Electronically Signed   By: Rise Mu M.D.   On: 11/26/2013 23:30     CXR: Personally reviewed. Support apparatus in acceptable position. ? Basilar infiltrate.  ASSESSMENT / PLAN:  Patient Active Problem List   Diagnosis Date Noted  . Altered mental status 11/27/2013  . Failure to thrive 11/27/2013  . Hyperglycemia 11/27/2013  . Hypernatremia 11/27/2013  . Metabolic acidosis 11/27/2013  . Elevated troponin  11/27/2013  . Hypovolemic shock 11/27/2013  . Anemia 11/27/2013  . Infection of urinary tract 11/27/2013  . Sepsis 11/27/2013  . Depressive disorder, not elsewhere classified 08/03/2013  . Acute kidney failure, unspecified 03/22/2013  . Loss of weight 02/20/2013  . Alzheimer's disease 01/20/2013  . Essential hypertension, benign 01/20/2013  . Osteoporosis, unspecified 01/20/2013  . Allergic rhinitis, cause unspecified 01/20/2013     PULMONARY A: Intubated for airway protection, r/o aspiration, acute resp failure, P:   VAP prevention SBT attempt , cpap5 ps 5, goal 2 hr Await neurop improvement prior to extubation Correct Na Need to discuss guardian DNI upon extubation abg reviewed, may need rise in MV, repeat abg as A line noted  CARDIOVASCULAR A:  Hypovolemic Shock Atrial Fibrillation  Elevated Troponin: This is likely 2/2 a combination of AKI and demand ischemia. P:  cvp 8 after 5 liters , continue hydration no role trop, lactic further Levo to MAp goal 60, max at 25 mic, would be medially ineffective to increase\ Cortisol then stress roids Bolus further  RENAL A:   Acute Kidney Injury: Most likely 2/2 hypovolemina. Hypernatremia: Metabolic Acidosis: Most likely 2/2 AKI P:   1/2 NS bolus d5w bmet frequent  GASTROINTESTINAL A:  No acute issue Start TF  ppi  HEMATOLOGIC A:  Anemia: Most likely AOCD P:  Monitor  INFECTIOUS A:   UTI R/o asp P:   Follow-up cultures Cont Emperic Vanc/Zosyn pcxr in am  New foley  ENDOCRINE A:  Hyperglycemia  , r/o rel AI P:   SSI \\cortisl  tsh Stress roids  NEUROLOGIC A:   AMS: sepsis, hypernatremia P:   Correct NA slowly  GOC: Patient appears to be ward of the state. SSI worker is Roxana Hires [phone]. I attempted to contact her tonight but only got voicemail. Overall prognosis is terrible and patient has an end-stage disease. Unclear that ICU interventions appropriate.    TODAY'S SUMMARY:   I  have personally obtained a history, examined the patient, evaluated laboratory and imaging results, formulated the assessment and plan and placed orders. CRITICAL CARE: The patient is critically ill with multiple organ systems failure and requires high complexity decision making for assessment and support, frequent evaluation and titration of therapies, application of advanced monitoring technologies and extensive interpretation of multiple databases. Critical Care Time devoted to patient care services described in this note is 30 minutes.   Mcarthur Rossetti. Tyson Alias, MD, FACP Pgr: [phone] [facility]  Pulmonary and Critical Care Medicine [facility] Pager: [phone]  11/27/2013, 12:09 PM

## 2013-11-27 NOTE — ED Notes (Signed)
dk & light green & lavender tubes with 2nd set of BC obtained with central line placement.

## 2013-11-27 NOTE — ED Notes (Signed)
Poor SPO2 signal continues/remains.  ABG results reviewed.

## 2013-11-28 ENCOUNTER — Inpatient Hospital Stay (HOSPITAL_COMMUNITY): Payer: PRIVATE HEALTH INSURANCE

## 2013-11-28 DIAGNOSIS — D649 Anemia, unspecified: Secondary | ICD-10-CM

## 2013-11-28 DIAGNOSIS — R4182 Altered mental status, unspecified: Secondary | ICD-10-CM

## 2013-11-28 DIAGNOSIS — F0280 Dementia in other diseases classified elsewhere without behavioral disturbance: Secondary | ICD-10-CM

## 2013-11-28 DIAGNOSIS — E860 Dehydration: Secondary | ICD-10-CM

## 2013-11-28 DIAGNOSIS — N179 Acute kidney failure, unspecified: Secondary | ICD-10-CM

## 2013-11-28 DIAGNOSIS — G309 Alzheimer's disease, unspecified: Secondary | ICD-10-CM

## 2013-11-28 LAB — CBC WITH DIFFERENTIAL/PLATELET
BASOS PCT: 0 % (ref 0–1)
Basophils Absolute: 0 10*3/uL (ref 0.0–0.1)
EOS PCT: 0 % (ref 0–5)
Eosinophils Absolute: 0 10*3/uL (ref 0.0–0.7)
HCT: 36.1 % (ref 36.0–46.0)
HEMOGLOBIN: 11.4 g/dL — AB (ref 12.0–15.0)
LYMPHS PCT: 4 % — AB (ref 12–46)
Lymphs Abs: 1.4 10*3/uL (ref 0.7–4.0)
MCH: 31 pg (ref 26.0–34.0)
MCHC: 31.6 g/dL (ref 30.0–36.0)
MCV: 98.1 fL (ref 78.0–100.0)
Monocytes Absolute: 0.7 10*3/uL (ref 0.1–1.0)
Monocytes Relative: 2 % — ABNORMAL LOW (ref 3–12)
Neutro Abs: 31.9 10*3/uL — ABNORMAL HIGH (ref 1.7–7.7)
Neutrophils Relative %: 94 % — ABNORMAL HIGH (ref 43–77)
Platelets: 136 10*3/uL — ABNORMAL LOW (ref 150–400)
RBC: 3.68 MIL/uL — ABNORMAL LOW (ref 3.87–5.11)
RDW: 14.6 % (ref 11.5–15.5)
WBC: 34 10*3/uL — AB (ref 4.0–10.5)

## 2013-11-28 LAB — BASIC METABOLIC PANEL
BUN: 57 mg/dL — ABNORMAL HIGH (ref 6–23)
BUN: 52 mg/dL — ABNORMAL HIGH (ref 6–23)
CALCIUM: 6.9 mg/dL — AB (ref 8.4–10.5)
CALCIUM: 7 mg/dL — AB (ref 8.4–10.5)
CO2: 13 mEq/L — ABNORMAL LOW (ref 19–32)
CO2: 15 mEq/L — ABNORMAL LOW (ref 19–32)
CREATININE: 1.65 mg/dL — AB (ref 0.50–1.10)
Chloride: 122 mEq/L — ABNORMAL HIGH (ref 96–112)
Chloride: 118 mEq/L — ABNORMAL HIGH (ref 96–112)
Creatinine, Ser: 1.6 mg/dL — ABNORMAL HIGH (ref 0.50–1.10)
GFR calc non Af Amer: 27 mL/min — ABNORMAL LOW (ref >90)
GFR, EST AFRICAN AMERICAN: 31 mL/min — AB (ref >90)
GFR, EST AFRICAN AMERICAN: 32 mL/min — AB (ref >90)
GFR, EST NON AFRICAN AMERICAN: 28 mL/min — AB (ref >90)
Glucose, Bld: 197 mg/dL — ABNORMAL HIGH (ref 70–99)
Glucose, Bld: 236 mg/dL — ABNORMAL HIGH (ref 70–99)
Potassium: 3.7 mEq/L (ref 3.7–5.3)
Potassium: 4 mEq/L (ref 3.7–5.3)
SODIUM: 152 meq/L — AB (ref 137–147)
Sodium: 152 mEq/L — ABNORMAL HIGH (ref 137–147)

## 2013-11-28 LAB — BLOOD GAS, ARTERIAL
Acid-base deficit: 11.2 mmol/L — ABNORMAL HIGH (ref 0.0–2.0)
Bicarbonate: 13.4 mEq/L — ABNORMAL LOW (ref 20.0–24.0)
DRAWN BY: 39866
FIO2: 0.4 %
O2 Saturation: 99.4 %
PATIENT TEMPERATURE: 97.7
PCO2 ART: 24.8 mmHg — AB (ref 35.0–45.0)
PEEP/CPAP: 5 cmH2O
PH ART: 7.349 — AB (ref 7.350–7.450)
RATE: 18 resp/min
TCO2: 14.2 mmol/L (ref 0–100)
VT: 400 mL
pO2, Arterial: 161 mmHg — ABNORMAL HIGH (ref 80.0–100.0)

## 2013-11-28 LAB — GLUCOSE, CAPILLARY
GLUCOSE-CAPILLARY: 177 mg/dL — AB (ref 70–99)
GLUCOSE-CAPILLARY: 155 mg/dL — AB (ref 70–99)
GLUCOSE-CAPILLARY: 169 mg/dL — AB (ref 70–99)
GLUCOSE-CAPILLARY: 134 mg/dL — AB (ref 70–99)
GLUCOSE-CAPILLARY: 137 mg/dL — AB (ref 70–99)
GLUCOSE-CAPILLARY: 157 mg/dL — AB (ref 70–99)
GLUCOSE-CAPILLARY: 153 mg/dL — AB (ref 70–99)
GLUCOSE-CAPILLARY: 168 mg/dL — AB (ref 70–99)
GLUCOSE-CAPILLARY: 156 mg/dL — AB (ref 70–99)
GLUCOSE-CAPILLARY: 131 mg/dL — AB (ref 70–99)
GLUCOSE-CAPILLARY: 117 mg/dL — AB (ref 70–99)
GLUCOSE-CAPILLARY: 217 mg/dL — AB (ref 70–99)
Glucose-Capillary: 146 mg/dL — ABNORMAL HIGH (ref 70–99)
Glucose-Capillary: 159 mg/dL — ABNORMAL HIGH (ref 70–99)
Glucose-Capillary: 174 mg/dL — ABNORMAL HIGH (ref 70–99)
Glucose-Capillary: 177 mg/dL — ABNORMAL HIGH (ref 70–99)
Glucose-Capillary: 204 mg/dL — ABNORMAL HIGH (ref 70–99)
Glucose-Capillary: 232 mg/dL — ABNORMAL HIGH (ref 70–99)
Glucose-Capillary: 156 mg/dL — ABNORMAL HIGH (ref 70–99)
Glucose-Capillary: 159 mg/dL — ABNORMAL HIGH (ref 70–99)
Glucose-Capillary: 177 mg/dL — ABNORMAL HIGH (ref 70–99)
Glucose-Capillary: 158 mg/dL — ABNORMAL HIGH (ref 70–99)

## 2013-11-28 LAB — COMPREHENSIVE METABOLIC PANEL
ALT: 25 U/L (ref 0–35)
AST: 32 U/L (ref 0–37)
Albumin: 2.2 g/dL — ABNORMAL LOW (ref 3.5–5.2)
Alkaline Phosphatase: 90 U/L (ref 39–117)
BUN: 65 mg/dL — ABNORMAL HIGH (ref 6–23)
CALCIUM: 7.2 mg/dL — AB (ref 8.4–10.5)
CO2: 12 mEq/L — ABNORMAL LOW (ref 19–32)
CREATININE: 1.99 mg/dL — AB (ref 0.50–1.10)
Chloride: 123 mEq/L — ABNORMAL HIGH (ref 96–112)
GFR calc Af Amer: 25 mL/min — ABNORMAL LOW (ref >90)
GFR calc non Af Amer: 21 mL/min — ABNORMAL LOW (ref >90)
Glucose, Bld: 153 mg/dL — ABNORMAL HIGH (ref 70–99)
Potassium: 2.9 mEq/L — CL (ref 3.7–5.3)
SODIUM: 154 meq/L — AB (ref 137–147)
TOTAL PROTEIN: 5.6 g/dL — AB (ref 6.0–8.3)
Total Bilirubin: 0.3 mg/dL (ref 0.3–1.2)

## 2013-11-28 LAB — PHOSPHORUS
PHOSPHORUS: 2.1 mg/dL — AB (ref 2.3–4.6)
Phosphorus: 2.5 mg/dL (ref 2.3–4.6)

## 2013-11-28 LAB — MAGNESIUM
MAGNESIUM: 2.2 mg/dL (ref 1.5–2.5)
Magnesium: 2.1 mg/dL (ref 1.5–2.5)

## 2013-11-28 LAB — CORTISOL: CORTISOL PLASMA: 97.3 ug/dL

## 2013-11-28 MED ORDER — SODIUM CHLORIDE 0.45 % IV SOLN: INTRAVENOUS | Status: DC

## 2013-11-28 MED ORDER — STERILE WATER FOR INJECTION IV SOLN
INTRAVENOUS | Status: DC
  Administered 2013-11-28 – 2013-11-29 (×3): via INTRAVENOUS
  Filled 2013-11-28 (×5): qty 9.7

## 2013-11-28 MED ORDER — INSULIN ASPART 100 UNIT/ML ~~LOC~~ SOLN
2.0000 [IU] | SUBCUTANEOUS | Status: DC
  Administered 2013-11-28: 6 [IU] via SUBCUTANEOUS
  Administered 2013-11-29: 4 [IU] via SUBCUTANEOUS
  Administered 2013-11-29: 2 [IU] via SUBCUTANEOUS
  Administered 2013-11-29: 4 [IU] via SUBCUTANEOUS

## 2013-11-28 MED ORDER — INSULIN GLARGINE 100 UNIT/ML ~~LOC~~ SOLN
30.0000 [IU] | SUBCUTANEOUS | Status: DC
  Administered 2013-11-28 – 2013-11-29 (×2): 30 [IU] via SUBCUTANEOUS
  Filled 2013-11-28 (×4): qty 0.3

## 2013-11-28 MED ORDER — INSULIN ASPART 100 UNIT/ML ~~LOC~~ SOLN
5.0000 [IU] | SUBCUTANEOUS | Status: DC
  Administered 2013-11-30: 5 [IU] via SUBCUTANEOUS

## 2013-11-28 MED ORDER — DEXTROSE 10 % IV SOLN: INTRAVENOUS | Status: DC | PRN

## 2013-11-28 MED ORDER — POTASSIUM CHLORIDE 20 MEQ/15ML (10%) PO LIQD
40.0000 meq | ORAL | Status: AC
  Administered 2013-11-28 (×2): 40 meq
  Filled 2013-11-28 (×2): qty 30

## 2013-11-28 NOTE — Progress Notes (Signed)
Called guarding. Descrobed medical futility for any fors life support and DNI upon extubation. She stated that "i dont make people dnr unless they are dying" I explained that we are hopeful to continue to correct her Na and extubate successfully but in event code and resp failure in future aggressive intervention would be medically infective therapy and cruel. She will "speak to her legal team" and get back to me. I again stressed that as her treating physician it would be medically ineffective, and futile to provide life support in future under any circumstances.  Mcarthur Rossettianiel J. Tyson AliasFeinstein, MD, FACP Pgr: [phone] [facility]

## 2013-11-28 NOTE — Progress Notes (Signed)
eLink Physician-Brief Progress Note Patient Name: Tiffany Mckinney DOB: 11-Jan-1923 MRN: [id number]  Date of Service  11/28/2013   HPI/Events of Note  hypokalemia  eICU Interventions  Potassium replaced   Intervention Category Intermediate Interventions: Electrolyte abnormality - evaluation and management  DETERDING,ELIZABETH 11/28/2013, 5:48 AM

## 2013-11-28 NOTE — Progress Notes (Signed)
PULMONARY / CRITICAL CARE MEDICINE   Name: Tiffany Mckinney MRN: [id number] DOB: [birth year]    ADMISSION DATE:  11/26/2013  PRIMARY SERVICE: PCCM  CHIEF COMPLAINT:  AMS  BRIEF PATIENT DESCRIPTION: 42 F with End-Stage Alzheimer's Disease, ward of state, transferred from nursing home with AMS, hypotension likely 2/2 worsening AMS 2/2 UTI leading to dehydration.  SIGNIFICANT EVENTS / STUDIES:  Intubated in ED 3/16 R [place] CVC placed by ED knotted and replaced 3/16 3/17- Na improved, more awake  LINES / TUBES: ETT 3/16 R [place] CVC 3/16>>>3/17 R Radial Arterial Line 3/17>>> Foley Catheter 3/16  CULTURES: Blood Culture x 2 3/16 Urine Culture  3/16 Sputum Culture 3/16  ANTIBIOTICS: Vanc 3/16>>>3/17 Zosyn 3/16>>>  SUBJECTIVE: Na improved  VITAL SIGNS: Temp:  [97.2 F (36.2 C)-101.5 F (38.6 C)] 98.7 F (37.1 C) (03/17 1211) Pulse Rate:  [66-160] 73 (03/17 1211) Resp:  [14-36] 27 (03/17 1211) BP: (88-129)/(34-95) 92/36 mmHg (03/17 1211) SpO2:  [99 %-100 %] 100 % (03/17 1211) Arterial Line BP: (75-198)/(41-195) 82/42 mmHg (03/17 1200) FiO2 (%):  [40 %] 40 % (03/17 1211) Weight:  [66.1 kg (145 lb 11.6 oz)] 66.1 kg (145 lb 11.6 oz) (03/17 0230) HEMODYNAMICS: CVP:  [4 mmHg-13 mmHg] 7 mmHg VENTILATOR SETTINGS: Vent Mode:  [-] PSV;CPAP FiO2 (%):  [40 %] 40 % Set Rate:  [18 bmp] 18 bmp Vt Set:  [400 mL] 400 mL PEEP:  [5 cmH20] 5 cmH20 Pressure Support:  [5 cmH20] 5 cmH20 Plateau Pressure:  [10 cmH20-16 cmH20] 10 cmH20 INTAKE / OUTPUT: Intake/Output     03/16 0701 - 03/17 0700 03/17 0701 - 03/18 0700   I.V. (mL/kg) 5361.9 (81.1) 731 (11.1)   NG/GT 616.7 330   IV Piggyback 200 50   Total Intake(mL/kg) 6178.6 (93.5) 1111 (16.8)   Urine (mL/kg/hr) 1485 (0.9) 800 (2.3)   Total Output 1485 800   Net +4693.6 +311        Urine Occurrence 1 x      PHYSICAL EXAMINATION: General:  Intubated Elderly F Neuro: more responsive, pushing at [place], cough noted HEENT:  jvd  wnl Cardiovascular:  RRR, NS1/S2, (-) MRG Lungs:  CTAB Abdomen: soft, bs wnl , no r/g Musculoskeletal:  (-) C/C/E Skin:  Intact  LABS:  CBC  Recent Labs Lab 11/26/13 2354 11/27/13 0150 11/28/13 0344  WBC 32.4* 23.9* 34.0*  HGB 16.4* 11.3* 11.4*  HCT 50.6* 36.3 36.1  PLT 204 147* 136*   Coag's  Recent Labs Lab 11/27/13 0103  APTT 24  INR 1.29   BMET  Recent Labs Lab 11/27/13 1128 11/27/13 1400 11/28/13 0344  NA 168* 165* 154*  K 3.6* 3.4* 2.9*  CL >130* >130* 123*  CO2 13* 13* 12*  BUN 90* 83* 65*  CREATININE 2.60* 2.45* 1.99*  GLUCOSE 149* 163* 153*   Electrolytes  Recent Labs Lab 11/27/13 1128 11/27/13 1400 11/27/13 1549 11/28/13 0344  CALCIUM 7.9* 7.7*  --  7.2*  MG  --   --  2.5 2.2  PHOS  --   --  3.4 2.5   Sepsis Markers  Recent Labs Lab 11/27/13 0002 11/27/13 0400  LATICACIDVEN 3.67* 1.9   ABG  Recent Labs Lab 11/27/13 0320 11/27/13 1309 11/28/13 0415  PHART 7.292* 7.312* 7.349*  PCO2ART 29.7* 27.0* 24.8*  PO2ART 308.0* 164.0* 161.0*   Liver Enzymes  Recent Labs Lab 11/26/13 2354 11/28/13 0344  AST 35 32  ALT 34 25  ALKPHOS 96 90  BILITOT 0.6 0.3  ALBUMIN  3.3* 2.2*   Cardiac Enzymes  Recent Labs Lab 11/27/13 0327 11/27/13 0728 11/27/13 1400  TROPONINI 0.58* 0.48* 0.58*   Glucose  Recent Labs Lab 11/28/13 0158 11/28/13 0256 11/28/13 0358 11/28/13 0501 11/28/13 0606 11/28/13 0649  GLUCAP 232* 159* 146* 134* 137* 157*    Imaging Dg Cervical Spine 1 View  11/27/2013   CLINICAL DATA:  Central line placement.  EXAM: DG CERVICAL SPINE - 1 VIEW  COMPARISON:  None.  FINDINGS: Right IJ catheter projects retrograde to the level of the base of the skull. Endotracheal tube noted.  IMPRESSION: Right IJ approach central venous catheter tip is at the level of the skullbase.  Critical Value/emergent results were called by telephone at the time of interpretation on 11/27/2013 at 1:52 AM to Dr. Derwood KaplanANKIT NANAVATI , who  verbally acknowledged these results.   Electronically Signed   By: Drusilla Kannerhomas  Dalessio M.D.   On: 11/27/2013 01:52   Ct Head Wo Contrast  11/27/2013   CLINICAL DATA:  Altered mental status  EXAM: CT HEAD WITHOUT CONTRAST  TECHNIQUE: Contiguous axial images were obtained from the base of the skull through the vertex without intravenous contrast.  COMPARISON:  Prior CT from 01/30/2010  FINDINGS: Advanced age-related atrophy with chronic microvascular ischemic disease is present, similar as compared to prior CT from 01/30/2010.  There is no acute intracranial hemorrhage or infarct. No mass lesion or midline shift. Gray-white matter differentiation is well maintained. Ventricles are normal in size without evidence of hydrocephalus. CSF containing spaces are within normal limits. No extra-axial fluid collection. Tiny remote lacunar infarct noted within the left pons.  The calvarium is intact.  Orbital soft tissues are within normal limits. Postsurgical changes noted about both globes.  Probable retention cyst noted within the right maxillary sinus. Otherwise, the paranasal sinuses and mastoid air cells are well pneumatized and free of fluid.  Scalp soft tissues are unremarkable.  IMPRESSION: 1. No acute intracranial process. 2. Stable atrophy with chronic microvascular ischemic disease.   Electronically Signed   By: Rise MuBenjamin  McClintock M.D.   On: 11/27/2013 01:09   Dg Chest Port 1 View  11/28/2013   CLINICAL DATA:  Reassess endotracheal tube position  EXAM: PORTABLE CHEST - 1 VIEW  COMPARISON:  DG CHEST 1V PORT dated 11/27/2013  FINDINGS: The endotracheal tube tip lies approximately 4 cm above the crotch of the carina. The right subclavian venous catheter tip lies in the region of the midportion of the SVC. The esophagogastric tube tip and proximal port project off the inferior margin of the film.  The right lung is adequately inflated. There is no focal infiltrate. On the left the retrocardiac region is dense  consistent with minimal atelectasis. No alveolar edema or infiltrate is demonstrated.  IMPRESSION: 1. The support tubes and lines appear to be in appropriate position. 2. Left lower lobe atelectasis or infiltrate is more conspicuous today resulting in increased obscuration of the hemidiaphragm.   Electronically Signed   By: David  Swazil[place]   On: 11/28/2013 08:03   Dg Chest Port 1 View  11/27/2013   CLINICAL DATA:  Central line placement.  EXAM: PORTABLE CHEST - 1 VIEW  COMPARISON:  DG CHEST 1V PORT dated 11/27/2013  FINDINGS: Endotracheal tube noted in stable position. Previously identified central line has been redirected, its tip is projected over the superior vena cava. Persistent left base atelectasis versus infiltrate noted. Stable nodular density versus costochondral calcification right lung base. No pleural effusion or pneumothorax. Heart size is stable.  IMPRESSION: 1. Interim repositioning of central line, its tip is in the superior vena cava.  2. Left basilar atelectasis versus mild infiltrate.   Electronically Signed   By: Maisie Fus  Register   On: 11/27/2013 02:22   Dg Chest Port 1 View  11/27/2013   CLINICAL DATA:  Placement central venous catheter.  EXAM: PORTABLE CHEST - 1 VIEW  COMPARISON:  DG CHEST 1V PORT dated 11/26/2013  FINDINGS: Endotracheal tube noted with tip 4.5 cm above the carina. Central line on the right is noted looping in the supraclavicular region and coursing superiorly, most likely in the right jugular vein. Mild left basilar atelectasis present. Lungs are otherwise clear. No pneumothorax. Heart size appropriate vascularity normal. No acute bony abnormality identified.  IMPRESSION: 1. Right central line noted looping in the subclavicular region and coursing up the distribution of the right internal jugular vein. Repositioning should be considered. This report was phoned to the patient's caregiver at the time of study. Endotracheal tube noted in good anatomic position. 2. Mild left  base subsegmental atelectasis.   Electronically Signed   By: Maisie Fus  Register   On: 11/27/2013 01:54   Dg Chest Port 1 View  11/26/2013   CLINICAL DATA:  Altered mental status, respiratory distress  EXAM: PORTABLE CHEST - 1 VIEW  COMPARISON:  Prior radiograph from 10/03/2008  FINDINGS: The cardiac and mediastinal silhouettes are stable in size and contour, and remain within normal limits.  The lungs are mildly hypoinflated. Minimal linear opacity at the left lung base most likely reflects atelectasis. No airspace consolidation, pleural effusion, or pulmonary edema is identified. There is no pneumothorax.  No acute osseous abnormality identified.  IMPRESSION: Mild left basal atelectasis. No acute cardiopulmonary abnormality identified.   Electronically Signed   By: Rise Mu M.D.   On: 11/26/2013 23:30     CXR: Personally reviewed. Support apparatus in acceptable position. ? Basilar infiltrate.  ASSESSMENT / PLAN:  Patient Active Problem List   Diagnosis Date Noted  . Altered mental status 11/27/2013  . Failure to thrive 11/27/2013  . Hyperglycemia 11/27/2013  . Hypernatremia 11/27/2013  . Metabolic acidosis 11/27/2013  . Elevated troponin 11/27/2013  . Hypovolemic shock 11/27/2013  . Anemia 11/27/2013  . Infection of urinary tract 11/27/2013  . Sepsis 11/27/2013  . Depressive disorder, not elsewhere classified 08/03/2013  . Acute kidney failure, unspecified 03/22/2013  . Loss of weight 02/20/2013  . Alzheimer's disease 01/20/2013  . Essential hypertension, benign 01/20/2013  . Osteoporosis, unspecified 01/20/2013  . Allergic rhinitis, cause unspecified 01/20/2013     PULMONARY A: Intubated for airway protection, no evidence of aspiration, acute resp failure P:   Wean sbt, cpap5 ps 5, goal 1 hr Assess cough, strength Will discuss dni, reintubation would be unethical and medically ineffective pcxr no infiltrate or edema, allow pos balance  CARDIOVASCULAR A:   Hypovolemic Shock- improved Also uti sepsis contribution Atrial Fibrillation  Elevated Troponin: This is likely 2/2 a combination of AKI and demand ischemia. NO Rel AI P:  Dc line Dc aline Levo to MAP goal 50 Dc stress roids  RENAL A:   Acute Kidney Injury:hypovolemina, improved Hypernatremia: Metabolic Acidosis: Most likely 2/2 AKI P:   D5w, consider addition bicarb for non ag portion bmet frequent, q8h  GASTROINTESTINAL A: Hold TF for weaning ppi  HEMATOLOGIC A:  Anemia: Most likely now dilution P:  Monitor in am  Sub q hep  INFECTIOUS A:   UTI P:   Cont Emperic Zosyn Dc vanc  ENDOCRINE A:  Hyperglycemia  , NO evidence of  P:   SSI Dc roids  NEUROLOGIC A:   AMS: sepsis, hypernatremia P:   Correct NA slowly  GOC: Patient appears to be ward of the state.wean, cal guardian, add bicarb  I have personally obtained a history, examined the patient, evaluated laboratory and imaging results, formulated the assessment and plan and placed orders. CRITICAL CARE: The patient is critically ill with multiple organ systems failure and requires high complexity decision making for assessment and support, frequent evaluation and titration of therapies, application of advanced monitoring technologies and extensive interpretation of multiple databases. Critical Care Time devoted to patient care services described in this note is 30 minutes.   Mcarthur Rossetti. Tyson Alias, MD, FACP Pgr: [phone] [facility]  Pulmonary and Critical Care Medicine [facility] Pager: [phone]  11/28/2013, 12:16 PM

## 2013-11-28 NOTE — Progress Notes (Signed)
CRITICAL VALUE ALERT  Critical value received:  k-2.9  Date of notification:  11/28/13  Time of notification:  0545  Critical value read back:yes  Nurse who received alert:  Elaya Droege, RN  MD notified (1st page):  Dr. Darrick Pennaeterding  Time of first page:  [phone]  MD notified (2nd page):  Time of second page:  Responding MD:  Dr. Darrick Pennaeterding  Time MD responded:  [phone]

## 2013-11-28 NOTE — Procedures (Signed)
Extubation Procedure Note  Patient Details:   Name: Tiffany Mckinney DOB: [birth year] MRN: [id number]   Airway Documentation:     Evaluation  O2 sats: stable throughout Complications: No apparent complications Patient did tolerate procedure well. Bilateral Breath Sounds: Rhonchi Suctioning: Airway No Patient tolerated wean and MD ordered to extubate. Positive for cuff leak. Extubated to a 4 Lpm nasal cannula. Patient unable to perform IS. No signs of dyspnea or stridor. Patient resting comfortably at this time. RN at bedside. Ancil BoozerSmallwood, Undray Allman 11/28/2013, 1:26 PM

## 2013-11-28 NOTE — Progress Notes (Signed)
Notification of Study Enrollment  This patient has been enrolled in the IRB-approved STOPPIT study, looking at avoiding PPI's when enteral feeds reach at least 30cc/hr.  Please do not place on acid suppression therapy for prophylaxis prior to contacting study team.  Shelba FlakeNathan E. Achilles Dunkope, PharmD Clinical Pharmacist - Resident Pager: [phone] Pharmacy: [phone] 11/28/2013 1:04 PM

## 2013-11-29 ENCOUNTER — Inpatient Hospital Stay (HOSPITAL_COMMUNITY): Payer: PRIVATE HEALTH INSURANCE

## 2013-11-29 DIAGNOSIS — I4891 Unspecified atrial fibrillation: Secondary | ICD-10-CM

## 2013-11-29 LAB — GLUCOSE, CAPILLARY
GLUCOSE-CAPILLARY: 124 mg/dL — AB (ref 70–99)
GLUCOSE-CAPILLARY: 160 mg/dL — AB (ref 70–99)
GLUCOSE-CAPILLARY: 177 mg/dL — AB (ref 70–99)
GLUCOSE-CAPILLARY: 75 mg/dL (ref 70–99)
GLUCOSE-CAPILLARY: 92 mg/dL (ref 70–99)
Glucose-Capillary: 99 mg/dL (ref 70–99)

## 2013-11-29 LAB — BASIC METABOLIC PANEL
BUN: 50 mg/dL — ABNORMAL HIGH (ref 6–23)
BUN: 51 mg/dL — ABNORMAL HIGH (ref 6–23)
CALCIUM: 6.8 mg/dL — AB (ref 8.4–10.5)
CHLORIDE: 118 meq/L — AB (ref 96–112)
CHLORIDE: 113 meq/L — AB (ref 96–112)
CO2: 19 mEq/L (ref 19–32)
CO2: 16 mEq/L — ABNORMAL LOW (ref 19–32)
Calcium: 7.3 mg/dL — ABNORMAL LOW (ref 8.4–10.5)
Creatinine, Ser: 1.64 mg/dL — ABNORMAL HIGH (ref 0.50–1.10)
Creatinine, Ser: 1.92 mg/dL — ABNORMAL HIGH (ref 0.50–1.10)
GFR calc non Af Amer: 27 mL/min — ABNORMAL LOW (ref >90)
GFR calc non Af Amer: 22 mL/min — ABNORMAL LOW (ref >90)
GFR, EST AFRICAN AMERICAN: 31 mL/min — AB (ref >90)
GFR, EST AFRICAN AMERICAN: 26 mL/min — AB (ref >90)
Glucose, Bld: 114 mg/dL — ABNORMAL HIGH (ref 70–99)
Glucose, Bld: 200 mg/dL — ABNORMAL HIGH (ref 70–99)
Potassium: 3.2 mEq/L — ABNORMAL LOW (ref 3.7–5.3)
Potassium: 3.7 mEq/L (ref 3.7–5.3)
Sodium: 152 mEq/L — ABNORMAL HIGH (ref 137–147)
Sodium: 146 mEq/L (ref 137–147)

## 2013-11-29 LAB — URINE CULTURE

## 2013-11-29 LAB — MAGNESIUM: MAGNESIUM: 2.1 mg/dL (ref 1.5–2.5)

## 2013-11-29 LAB — PHOSPHORUS: Phosphorus: 1.8 mg/dL — ABNORMAL LOW (ref 2.3–4.6)

## 2013-11-29 MED ORDER — POTASSIUM PHOSPHATE DIBASIC 3 MMOLE/ML IV SOLN
30.0000 mmol | Freq: Once | INTRAVENOUS | Status: AC
  Administered 2013-11-29: 30 mmol via INTRAVENOUS
  Filled 2013-11-29: qty 10

## 2013-11-29 MED ORDER — CEFTRIAXONE SODIUM 1 G IJ SOLR
1.0000 g | INTRAMUSCULAR | Status: DC
  Administered 2013-11-29 – 2013-11-30 (×2): 1 g via INTRAVENOUS
  Filled 2013-11-29 (×3): qty 10

## 2013-11-29 NOTE — Progress Notes (Signed)
eLink Physician-Brief Progress Note Patient Name: Tiffany LegacyKatherine M Wolz DOB: May 15, 1923 MRN: [id number]  Date of Service  11/29/2013   HPI/Events of Note  Hypokalemia and hypophosphatemia   eICU Interventions  Potassium and phos replaced   Intervention Category Intermediate Interventions: Electrolyte abnormality - evaluation and management  DETERDING,ELIZABETH 11/29/2013, 4:30 AM

## 2013-11-29 NOTE — Progress Notes (Signed)
Tiffany LegacyKatherine M Cataldo [id number] Admission Data: 11/29/2013 7:02 PM Attending Provider: Lonia FarberKonstantin Zubelevitskiy*  PCP:No primary provider on file. Consults/ Treatment Team:    Tiffany Mckinney is a 80 y.o. female patient admitted from ED awake, alert  & orientated  X 3,  Full Code, VSS - Blood pressure 105/70, pulse 89, temperature 98.4 F (36.9 C), temperature source Axillary, resp. rate 20, height 5\' 5"  (1.651 m), weight 66.1 kg (145 lb 11.6 oz), SpO2 94.00%., O2    2 L nasal cannular, no c/o shortness of breath, no c/o chest pain, no distress noted. Tele #9 placed and pt is currently running   IV site WDL:  hand right, condition patent and no redness with a transparent dsg that's clean dry and intact.  Allergies:  No Known Allergies   Past Medical History  Diagnosis Date  . Alzheimer's disease 01/20/2013  . Essential hypertension, benign 01/20/2013  . Osteoporosis, unspecified 01/20/2013  . Allergic rhinitis, cause unspecified 01/20/2013  . Femur fracture     Remote, Details Unknown  . Facial fracture     Remote, details unknown       Will cont to monitor and assist as needed.  Thane Age Consuella Loselaine, RN 11/29/2013 7:02 PM

## 2013-11-29 NOTE — Progress Notes (Signed)
At 1215 spoke with Marcelino DusterMichelle the case worker in regards to the pt's code status. Marcelino DusterMichelle informed us that they decided to leave her a full code and if she needed to be intubated that would be fine. If she were to be intubated, Marcelino DusterMichelle stated that she would like to be notified so that she could update her team.

## 2013-11-29 NOTE — Progress Notes (Addendum)
PULMONARY / CRITICAL CARE MEDICINE   Name: Tiffany Mckinney MRN: [id number] DOB: 1923/08/15    ADMISSION DATE:  11/26/2013  PRIMARY SERVICE: PCCM  CHIEF COMPLAINT:  AMS  BRIEF PATIENT DESCRIPTION: 54 F with End-Stage Alzheimer's Disease, ward of state, transferred from nursing home with AMS, hypotension likely 2/2 worsening AMS 2/2 UTI leading to dehydration.  SIGNIFICANT EVENTS / STUDIES:  Intubated in ED 3/16 R [place] CVC placed by ED knotted and replaced 3/16 3/17- Na improved, more awake  LINES / TUBES: ETT 3/16>>>3/17 R [place] CVC 3/16>>>3/17 R Radial Arterial Line 3/17>>>3/17 Foley Catheter 3/16  CULTURES: Blood Culture x 2 3/16 Urine Culture  3/16>>>Ecoli Sputum Culture 3/16  ANTIBIOTICS: Vanc 3/16>>>3/17 Zosyn 3/16>>>3/18 Ceftriaxone 3/18>>>  SUBJECTIVE: no distress  VITAL SIGNS: Temp:  [98.8 F (37.1 C)-99.7 F (37.6 C)] 99.6 F (37.6 C) (03/18 1400) Pulse Rate:  [77-90] 85 (03/18 1400) Resp:  [19-30] 20 (03/18 1400) BP: (59-114)/(25-74) 87/31 mmHg (03/18 1400) SpO2:  [100 %] 100 % (03/18 1400) HEMODYNAMICS:   VENTILATOR SETTINGS:   INTAKE / OUTPUT: Intake/Output     03/17 0701 - 03/18 0700 03/18 0701 - 03/19 0700   I.V. (mL/kg) 3253.4 (49.2) 1050 (15.9)   Other  20   NG/GT 330    IV Piggyback 860 50   Total Intake(mL/kg) 4443.4 (67.2) 1120 (16.9)   Urine (mL/kg/hr) 1855 (1.2) 575 (1.1)   Total Output 1855 575   Net +2588.4 +545          PHYSICAL EXAMINATION: General:  Intubated Elderly F Neuro: awake, likely at baseline HEENT:  jvd wnl Cardiovascular:  RRR, NS1/S2, (-) MRG Lungs:  CTAB Abdomen: soft, bs wnl , no r/g Musculoskeletal:  (-) C/C/E Skin:  Intact  LABS:  CBC  Recent Labs Lab 11/26/13 2354 11/27/13 0150 11/28/13 0344  WBC 32.4* 23.9* 34.0*  HGB 16.4* 11.3* 11.4*  HCT 50.6* 36.3 36.1  PLT 204 147* 136*   Coag's  Recent Labs Lab 11/27/13 0103  APTT 24  INR 1.29   BMET  Recent Labs Lab 11/28/13 1820  11/29/13 0320 11/29/13 1250  NA 152* 152* 146  K 4.0 3.2* 3.7  CL 118* 118* 113*  CO2 15* 19 16*  BUN 52* 50* 51*  CREATININE 1.60* 1.64* 1.92*  GLUCOSE 236* 114* 200*   Electrolytes  Recent Labs Lab 11/28/13 0344  11/28/13 1820 11/29/13 0320 11/29/13 1250  CALCIUM 7.2*  < > 7.0* 7.3* 6.8*  MG 2.2  --  2.1 2.1  --   PHOS 2.5  --  2.1* 1.8*  --   < > = values in this interval not displayed. Sepsis Markers  Recent Labs Lab 11/27/13 0002 11/27/13 0400  LATICACIDVEN 3.67* 1.9   ABG  Recent Labs Lab 11/27/13 0320 11/27/13 1309 11/28/13 0415  PHART 7.292* 7.312* 7.349*  PCO2ART 29.7* 27.0* 24.8*  PO2ART 308.0* 164.0* 161.0*   Liver Enzymes  Recent Labs Lab 11/26/13 2354 11/28/13 0344  AST 35 32  ALT 34 25  ALKPHOS 96 90  BILITOT 0.6 0.3  ALBUMIN 3.3* 2.2*   Cardiac Enzymes  Recent Labs Lab 11/27/13 0327 11/27/13 0728 11/27/13 1400  TROPONINI 0.58* 0.48* 0.58*   Glucose  Recent Labs Lab 11/28/13 1620 11/28/13 1917 11/28/13 2346 11/29/13 0400 11/29/13 0756 11/29/13 1157  GLUCAP 117* 217* 124* 99 92 177*    Imaging Dg Chest Port 1 View  11/28/2013   CLINICAL DATA:  Reassess endotracheal tube position  EXAM: PORTABLE CHEST - 1 VIEW  COMPARISON:  DG CHEST 1V PORT dated 11/27/2013  FINDINGS: The endotracheal tube tip lies approximately 4 cm above the crotch of the carina. The right subclavian venous catheter tip lies in the region of the midportion of the SVC. The esophagogastric tube tip and proximal port project off the inferior margin of the film.  The right lung is adequately inflated. There is no focal infiltrate. On the left the retrocardiac region is dense consistent with minimal atelectasis. No alveolar edema or infiltrate is demonstrated.  IMPRESSION: 1. The support tubes and lines appear to be in appropriate position. 2. Left lower lobe atelectasis or infiltrate is more conspicuous today resulting in increased obscuration of the  hemidiaphragm.   Electronically Signed   By: David  SwazilandJordan   On: 11/28/2013 08:03     ASSESSMENT / PLAN:  Patient Active Problem List   Diagnosis Date Noted  . Altered mental status 11/27/2013  . Failure to thrive 11/27/2013  . Hyperglycemia 11/27/2013  . Hypernatremia 11/27/2013  . Metabolic acidosis 11/27/2013  . Elevated troponin 11/27/2013  . Hypovolemic shock 11/27/2013  . Anemia 11/27/2013  . Infection of urinary tract 11/27/2013  . Sepsis 11/27/2013  . Depressive disorder, not elsewhere classified 08/03/2013  . Acute kidney failure, unspecified 03/22/2013  . Loss of weight 02/20/2013  . Alzheimer's disease 01/20/2013  . Essential hypertension, benign 01/20/2013  . Osteoporosis, unspecified 01/20/2013  . Allergic rhinitis, cause unspecified 01/20/2013     PULMONARY A: Intubated for airway protection, no evidence of aspiration, acute resp failure P:   reintubation would be futile. IS if able upeight  CARDIOVASCULAR A:  Hypovolemic Shock- improved Also uti sepsis contribution Atrial Fibrillation  Elevated Troponin: This is likely 2/2 a combination of AKI and demand ischemia. NO Rel AI P:  Dc tele Cont pos balance  RENAL A:   Acute Kidney Injury:hypovolemina, improved Hypernatremia: Non AG noted P:   Dc 1/2 NS Maintain bicarb for non AG at 75 Chem in am   GASTROINTESTINAL A: r/o dysphagia slp ppi  HEMATOLOGIC A:  Anemia: Most likely now dilution Leukocytosis with clinicap progress P:  Limit phlebotomy  Sub q hep  INFECTIOUS A:   UTI P:   Dc zosyn Add ceftriaxone, consider augmentin if able orals Needs 7 days  ENDOCRINE A:  Hyperglycemia  , NO evidence of  P:   SSI  NEUROLOGIC A:   AMS: sepsis, hypernatremia improving P:   Correct NA slowly  GOC: heroic measures would be medically ineffective. I question the judgement of the guardian Tiffany Mckinney to even if she knows anything about this patients history and if she is acting in a  reasonable manor about her care plan decisions.  I question any reasonable judgement about Tiffany Mckinney and plan to contact courts to look to have a person make decisions who in fact cares to have any knowledge about Tiffany Mckinney and hr poor health issues and prognosis. Lastly, Tiffany Mckinney likely has lack of judgement that I plan to investigate further in the best interest of this patient.   Mcarthur Rossettianiel J. Tyson AliasFeinstein, MD, FACP Pgr: [phone] [facility]  Pulmonary and Critical Care Medicine [facility] Pager: [phone]  11/29/2013, 3:17 PM    To floor,. To triad

## 2013-11-30 DIAGNOSIS — I10 Essential (primary) hypertension: Secondary | ICD-10-CM

## 2013-11-30 DIAGNOSIS — R799 Abnormal finding of blood chemistry, unspecified: Secondary | ICD-10-CM

## 2013-11-30 LAB — GLUCOSE, CAPILLARY
Glucose-Capillary: 126 mg/dL — ABNORMAL HIGH (ref 70–99)
Glucose-Capillary: 227 mg/dL — ABNORMAL HIGH (ref 70–99)
Glucose-Capillary: 230 mg/dL — ABNORMAL HIGH (ref 70–99)
Glucose-Capillary: 69 mg/dL — ABNORMAL LOW (ref 70–99)
Glucose-Capillary: 70 mg/dL (ref 70–99)
Glucose-Capillary: 82 mg/dL (ref 70–99)
Glucose-Capillary: 93 mg/dL (ref 70–99)

## 2013-11-30 LAB — BASIC METABOLIC PANEL
BUN: 38 mg/dL — AB (ref 6–23)
CO2: 22 mEq/L (ref 19–32)
Calcium: 7.2 mg/dL — ABNORMAL LOW (ref 8.4–10.5)
Chloride: 111 mEq/L (ref 96–112)
Creatinine, Ser: 1.38 mg/dL — ABNORMAL HIGH (ref 0.50–1.10)
GFR, EST AFRICAN AMERICAN: 38 mL/min — AB (ref >90)
GFR, EST NON AFRICAN AMERICAN: 33 mL/min — AB (ref >90)
Glucose, Bld: 99 mg/dL (ref 70–99)
POTASSIUM: 2.6 meq/L — AB (ref 3.7–5.3)
Sodium: 148 mEq/L — ABNORMAL HIGH (ref 137–147)

## 2013-11-30 LAB — HEMOGLOBIN A1C
Hgb A1c MFr Bld: 6.7 % — ABNORMAL HIGH (ref <5.7)
Mean Plasma Glucose: 146 mg/dL — ABNORMAL HIGH (ref <117)

## 2013-11-30 MED ORDER — DEXTROSE 50 % IV SOLN: 25.0000 mL | INTRAVENOUS | Status: DC | PRN

## 2013-11-30 MED ORDER — POTASSIUM CHLORIDE 20 MEQ/15ML (10%) PO LIQD
60.0000 meq | Freq: Four times a day (QID) | ORAL | Status: AC
  Administered 2013-11-30 (×2): 60 meq via ORAL
  Filled 2013-11-30 (×3): qty 45

## 2013-11-30 MED ORDER — DEXTROSE 50 % IV SOLN
INTRAVENOUS | Status: AC
  Filled 2013-11-30: qty 50

## 2013-11-30 MED ORDER — VANCOMYCIN HCL IN DEXTROSE 750-5 MG/150ML-% IV SOLN
750.0000 mg | INTRAVENOUS | Status: DC
  Administered 2013-11-30: 750 mg via INTRAVENOUS
  Filled 2013-11-30 (×2): qty 150

## 2013-11-30 MED ORDER — VITAL AF 1.2 CAL PO LIQD
1000.0000 mL | ORAL | Status: DC
  Administered 2013-12-01: 1000 mL
  Filled 2013-11-30 (×4): qty 1000

## 2013-11-30 MED ORDER — POTASSIUM CHLORIDE 20 MEQ PO PACK: 60.0000 meq | PACK | Freq: Four times a day (QID) | ORAL | Status: DC

## 2013-11-30 NOTE — Progress Notes (Signed)
Attempted to contact guardian Beckie SaltsMichelle Juchactz x3.  Message left to contact IV team office or 5W for consent for procedure.  Also attempted to contact supervisor Melbourne AbtsVannessa Funderburk without success.  Bedside RN aware that we are unable to place PICC at this time due to inability to obtain consent.  Agnes Probert, Lajean ManesKerry Loraine, RN VAST

## 2013-11-30 NOTE — Progress Notes (Signed)
[facility] PROGRESS NOTE   Tiffany Mckinney YNW:[id number] DOB: 09/02/1923 DOA: 11/26/2013 PCP: No primary provider on file.  HPI/Subjective: Patient is unresponsive to verbal stimuli. I did nasotracheal suction with RN at bedside, patient open her eyes to the bed do not 12.  Assessment/Plan: Active Problems:   Acute kidney failure, unspecified   Altered mental status   Failure to thrive   Hyperglycemia   Hypernatremia   Metabolic acidosis   Elevated troponin   Hypovolemic shock   Anemia   Infection of urinary tract   Sepsis    Sepsis -Sepsis syndrome likely secondary to UTI. -On the day of admission patient started on Zosyn and vancomycin, currently switched to Rocephin. -Escherichia coli UTI and staph aureus pneumonia.  Pneumonia -Left lower lobe pneumonia, tracheal aspirate shows staph aureus and Candida. -Pneumonia HCAP versus aspiration pneumonia. -Will start patient on vancomycin and Diflucan.  UTI -Escherichia coli UTI, patient currently on ceftriaxone.  Hypernatremia -Severe hypernatremia secondary to multiple etiologies including severe dehydration and failure to thrive. -She presented to the hospital with sodium level of 170, treated with D5W currently sodium was 147. -She is on bicarbonate drip and he acidosis resolved, I will discontinue the IV loops.  Elevated troponin -Non-ST segment elevation MI in setting of sepsis and dehydration. -Likely type II non-STEMI secondary to demand ischemia, start aspirin and beta blockers when BP tolerates.  Metabolic acidosis -Likely multifactorial secondary to acute renal failure and sepsis. -Bicarbonate was 13 upon admission, currently is 22. -This is resolved, Asian is on bicarbonate drip currently, we'll discontinue IV fluids.  Goals of care -Patient has advanced dementia, dysphagia and likely aspiration pneumonia. -Per notes patient does state ward, I will contact her guardian.  Code Status: Full  code Family Communication: Plan discussed with the patient. Disposition Plan: Remains inpatient   Consultants:  Patient was under PCCM till yesterday, triad hospitalist assumed care on 11/30/2013  Procedures:  ETT 3/16>>>3/17   R [place] CVC 3/16>>>3/17   R Radial Arterial Line 3/17>>>3/17   Foley Catheter 3/16   Antibiotics:  Vanc 3/16>>>3/17   Zosyn 3/16>>>3/18   Ceftriaxone 3/18>>>    Objective: Filed Vitals:   11/30/13 0403  BP: 107/63  Pulse: 84  Temp: 98.3 F (36.8 C)  Resp: 20    Intake/Output Summary (Last 24 hours) at 11/30/13 1117 Last data filed at 11/30/13 0030  Gross per 24 hour  Intake    795 ml  Output   1275 ml  Net   -480 ml   Filed Weights   11/27/13 0317 11/28/13 0230 11/30/13 0403  Weight: 63.5 kg (139 lb 15.9 oz) 66.1 kg (145 lb 11.6 oz) 68.7 kg (151 lb 7.3 oz)    Exam: General: Alert and awake, oriented x3, not in any acute distress. HEENT: anicteric sclera, pupils reactive to light and accommodation, EOMI CVS: S1-S2 clear, no murmur rubs or gallops Chest: clear to auscultation bilaterally, no wheezing, rales or rhonchi Abdomen: soft nontender, nondistended, normal bowel sounds, no organomegaly Extremities: no cyanosis, clubbing or edema noted bilaterally Neuro: Cranial nerves II-XII intact, no focal neurological deficits  Data Reviewed: Basic Metabolic Panel:  Recent Labs Lab 11/27/13 1400 11/27/13 1549 11/28/13 0344 11/28/13 1044 11/28/13 1820 11/29/13 0320 11/29/13 1250 11/30/13 0745  NA 165*  --  154* 152* 152* 152* 146 148*  K 3.4*  --  2.9* 3.7 4.0 3.2* 3.7 2.6*  CL >130*  --  123* 122* 118* 118* 113* 111  CO2 13*  --  12* 13* 15* 19 16* 22  GLUCOSE 163*  --  153* 197* 236* 114* 200* 99  BUN 83*  --  65* 57* 52* 50* 51* 38*  CREATININE 2.45*  --  1.99* 1.65* 1.60* 1.64* 1.92* 1.38*  CALCIUM 7.7*  --  7.2* 6.9* 7.0* 7.3* 6.8* 7.2*  MG  --  2.5 2.2  --  2.1 2.1  --   --   PHOS  --  3.4 2.5  --  2.1* 1.8*  --   --     Liver Function Tests:  Recent Labs Lab 11/26/13 2354 11/28/13 0344  AST 35 32  ALT 34 25  ALKPHOS 96 90  BILITOT 0.6 0.3  PROT 7.9 5.6*  ALBUMIN 3.3* 2.2*   No results found for this basename: LIPASE, AMYLASE,  in the last 168 hours No results found for this basename: AMMONIA,  in the last 168 hours CBC:  Recent Labs Lab 11/26/13 2354 11/27/13 0150 11/28/13 0344  WBC 32.4* 23.9* 34.0*  NEUTROABS  --   --  31.9*  HGB 16.4* 11.3* 11.4*  HCT 50.6* 36.3 36.1  MCV 98.1 101.1* 98.1  PLT 204 147* 136*   Cardiac Enzymes:  Recent Labs Lab 11/26/13 2356 11/27/13 0327 11/27/13 0728 11/27/13 1400  TROPONINI 0.58* 0.58* 0.48* 0.58*   BNP (last 3 results) No results found for this basename: PROBNP,  in the last 8760 hours CBG:  Recent Labs Lab 11/29/13 2054 11/30/13 0016 11/30/13 0144 11/30/13 0410 11/30/13 0809  GLUCAP 75 69* 82 70 93    Micro Recent Results (from the past 240 hour(s))  URINE CULTURE     Status: None   Collection Time    11/27/13 12:08 AM      Result Value Ref Range Status   Specimen Description URINE, CLEAN CATCH   Final   Special Requests NONE   Final   Culture  Setup Time     Final   Value: 11/27/2013 00:33     Performed at [facility]   Colony Count     Final   Value: >=100,000 COLONIES/ML     Performed at [facility]   Culture     Final   Value: ESCHERICHIA COLI     Performed at [facility]   Report Status 11/29/2013 FINAL   Final   Organism ID, Bacteria ESCHERICHIA COLI   Final  CULTURE, BLOOD (ROUTINE X 2)     Status: None   Collection Time    11/27/13 12:43 AM      Result Value Ref Range Status   Specimen Description BLOOD RIGHT ARTERIAL   Final   Special Requests BOTTLES DRAWN AEROBIC ONLY 4CC   Final   Culture  Setup Time     Final   Value: 11/27/2013 08:25     Performed at [facility]   Culture     Final   Value:        BLOOD CULTURE RECEIVED NO GROWTH TO DATE CULTURE WILL BE HELD  FOR 5 DAYS BEFORE ISSUING A FINAL NEGATIVE REPORT     Performed at [facility]   Report Status PENDING   Incomplete  CULTURE, BLOOD (ROUTINE X 2)     Status: None   Collection Time    11/27/13  1:57 AM      Result Value Ref Range Status   Specimen Description BLOOD CENTRAL LINE   Final   Special Requests     Final  Value: RIGHT [place] BOTTLES DRAWN AEROBIC AND ANAEROBIC 5CC EA PATIENT ON FOLLOWING VANCOMYCIN,ZOSYN   Culture  Setup Time     Final   Value: 11/27/2013 08:25     Performed at [facility]   Culture     Final   Value:        BLOOD CULTURE RECEIVED NO GROWTH TO DATE CULTURE WILL BE HELD FOR 5 DAYS BEFORE ISSUING A FINAL NEGATIVE REPORT     Performed at [facility]   Report Status PENDING   Incomplete  MRSA PCR SCREENING     Status: None   Collection Time    11/27/13  3:17 AM      Result Value Ref Range Status   MRSA by PCR NEGATIVE  NEGATIVE Final   Comment:            The GeneXpert MRSA Assay (FDA     approved for NASAL specimens     only), is one component of a     comprehensive MRSA colonization     surveillance program. It is not     intended to diagnose MRSA     infection nor to guide or     monitor treatment for     MRSA infections.  CULTURE, RESPIRATORY (NON-EXPECTORATED)     Status: None   Collection Time    11/28/13  1:25 AM      Result Value Ref Range Status   Specimen Description TRACHEAL ASPIRATE   Final   Special Requests Normal   Final   Gram Stain     Final   Value: ABUNDANT WBC PRESENT, PREDOMINANTLY PMN     RARE SQUAMOUS EPITHELIAL CELLS PRESENT     FEW YEAST     FEW GRAM POSITIVE COCCI     IN PAIRS   Culture     Final   Value: MODERATE STAPHYLOCOCCUS AUREUS     Note: RIFAMPIN AND GENTAMICIN SHOULD NOT BE USED AS SINGLE DRUGS FOR TREATMENT OF STAPH INFECTIONS.     MODERATE CANDIDA ALBICANS     Performed at [facility]   Report Status PENDING   Incomplete     Studies: Dg Abd Portable 1v  11/29/2013    CLINICAL DATA:  NG tube placement.  EXAM: PORTABLE ABDOMEN - 1 VIEW  COMPARISON:  None.  FINDINGS: NG tube tip is in the body of the stomach with the tip in the proximal stomach. Visualized upper abdominal bowel gas pattern is unremarkable.  IMPRESSION: NG tube tip in the body of the stomach.   Electronically Signed   By: Charlett Nose M.D.   On: 11/29/2013 17:44    Scheduled Meds: . cefTRIAXone (ROCEPHIN)  IV  1 g Intravenous Q24H  . chlorhexidine  15 mL Mouth/Throat BID  . docusate  100 mg Per Tube Daily  . escitalopram  20 mg Oral Daily  . feeding supplement (VITAL AF 1.2 CAL)  1,000 mL Per Tube Q24H  . heparin  5,000 Units Subcutaneous 3 times per day  . insulin aspart  2-6 Units Subcutaneous 6 times per day  . insulin aspart  5 Units Subcutaneous 6 times per day  . insulin glargine  30 Units Subcutaneous Q24H  . loratadine  10 mg Oral Daily  . olopatadine  1 drop Both Eyes BID  . potassium chloride  60 mEq Per NG tube Q6H  . [START ON 12/01/2013] Vitamin D (Ergocalciferol)  50,000 Units Oral Q30 days   Continuous Infusions: . dextrose    .  sodium bicarbonate infusion 1/4 NS 1000 mL 75 mL/hr at 11/29/13 2257       Time spent: 35 minutes    [facility]  [facility] Pager [phone] If 7PM-7AM, please contact night-coverage at [email or web address], password [facility] 11/30/2013, 11:17 AM  LOS: 4 days

## 2013-11-30 NOTE — ED Provider Notes (Signed)
Medical screening examination/treatment/procedure(s) were conducted as a shared visit with non-physician practitioner(s) and myself.  I personally evaluated the patient during the encounter.   EKG Interpretation   Date/Time:  Sunday November 26 2013 23:37:42 EDT Ventricular Rate:  124 PR Interval:    QRS Duration: 85 QT Interval:  281 QTC Calculation: 403 R Axis:   80 Text Interpretation:  Atrial flutter Repolarization abnormality, prob rate  related ED PHYSICIAN INTERPRETATION AVAILABLE IN [facility] Confirmed  by TEST, Record ([id number]) on 11/28/2013 9:49:00 AM       PLEASE SEE A SEPARATE NOTE ALREADY IN PLACE MADE BY ME.  Derwood KaplanAnkit Taim Wurm, MD 11/30/13 [phone]

## 2013-11-30 NOTE — Progress Notes (Signed)
Patient ZO:[id number]:Tiffany Mckinney      DOB: 12/25/1922      JWJ:[id number]  Consult to establish GOC received.  I have left a message for the patient's guardian/supervising guardian to call me back.   [name] L. Ladona Ridgelaylor, MD MBA The Palliative Medicine Team at [facility] Team Phone: [phone] Pager: [phone]

## 2013-11-30 NOTE — Progress Notes (Signed)
Pt transferred to 5W but pt still ordered ICU hyperglycemic protocol using the correction scale.  Please discontinue this protocol and use the glycemic contol order set using the tube feed coverage as ordered, sensitive correction and the lantus 30 units.  Thank you, Lenor CoffinAnn Yaelis Scharfenberg, RN, CNS, Diabetes Coordinator [phone])

## 2013-11-30 NOTE — Progress Notes (Signed)
CRITICAL VALUE ALERT  Critical value received:  YES  Date of notification:  11/30/2013  Time of notification:  0936  Critical value read back:YES  Nurse who received alert:  Carloyn JaegerSarah Arlis Everly  MD notified (1st page):  Face to face at 0937  Time of first page: 0937  MD notified (2nd page):  Time of second page:  Responding MD:   Time MD responded:

## 2013-11-30 NOTE — Significant Event (Signed)
   Mrs. Roxana H[place]Michelle [place] [facility] [facility] called me back.  I Have discussed with her the extent of patient condition right now.  I also discussed in details about patient's prognosis with dementia, dysphagia and currently probably aspiration pneumonia.  Mrs. Reuel DerbyJuchatz said she does not have the authority to make Mrs. Roseanne RenoStewart DNR/DNI, and only her supervisor ( Mrs. Lynnell DikeJenise Davis) has this authority, her phone number is [phone]  Clint LippsMutaz A Lanea Vankirk Pager: [phone] 11/30/2013, 12:17 PM

## 2013-11-30 NOTE — Progress Notes (Signed)
SLP Cancellation Note  Patient Details Name: Gerline LegacyKatherine M Caley MRN: [id number] DOB: October 23, 1922   Cancelled treatment:       Reason Eval/Treat Not Completed: Patient's level of consciousness. Pt is not appropriate for swallow eval, unresponsive. Will follow for readiness.   Harlon DittyBonnie Rushton Early, [place]MA CCC-SLP [phone]  Claudine MoutonDeBlois, Marshella Tello Caroline 11/30/2013, 11:38 AM

## 2013-11-30 NOTE — Progress Notes (Signed)
ANTIBIOTIC CONSULT NOTE - INITIAL  Pharmacy Consult for Vancomycin Indication: pneumonia ( probable aspiration PNA)   No Known Allergies  Patient Measurements: Height: 5\' 5"  (165.1 cm) Weight: 151 lb 7.3 oz (68.7 kg) IBW/kg (Calculated) : 57   Vital Signs: Temp: 98.3 F (36.8 C) (03/19 0403) Temp src: Axillary (03/19 0403) BP: 107/63 mmHg (03/19 0403) Pulse Rate: 84 (03/19 0403) Intake/Output from previous day: 03/18 0701 - 03/19 0700 In: 1395 [I.V.:1275; IV Piggyback:100] Out: 1275 [Urine:1275] Intake/Output from this shift: Total I/O In: -  Out: 700 [Urine:200; Stool:500]  Labs:  Recent Labs  11/28/13 0344  11/29/13 0320 11/29/13 1250 11/30/13 0745  WBC 34.0*  --   --   --   --   HGB 11.4*  --   --   --   --   PLT 136*  --   --   --   --   CREATININE 1.99*  < > 1.64* 1.92* 1.38*  < > = values in this interval not displayed. Estimated Creatinine Clearance: 27.4 ml/min (by C-G formula based on Cr of 1.38). No results found for this basename: VANCOTROUGH, Leodis Binet, VANCORANDOM, GENTTROUGH, GENTPEAK, GENTRANDOM, TOBRATROUGH, TOBRAPEAK, TOBRARND, AMIKACINPEAK, AMIKACINTROU, AMIKACIN,  in the last 72 hours   Microbiology: Recent Results (from the past 720 hour(s))  URINE CULTURE     Status: None   Collection Time    11/27/13 12:08 AM      Result Value Ref Range Status   Specimen Description URINE, CLEAN CATCH   Final   Special Requests NONE   Final   Culture  Setup Time     Final   Value: 11/27/2013 00:33     Performed at [facility] Count     Final   Value: >=100,000 COLONIES/ML     Performed at [facility]   Culture     Final   Value: ESCHERICHIA COLI     Performed at [facility]   Report Status 11/29/2013 FINAL   Final   Organism ID, Bacteria ESCHERICHIA COLI   Final  CULTURE, BLOOD (ROUTINE X 2)     Status: None   Collection Time    11/27/13 12:43 AM      Result Value Ref Range Status   Specimen Description BLOOD  RIGHT ARTERIAL   Final   Special Requests BOTTLES DRAWN AEROBIC ONLY 4CC   Final   Culture  Setup Time     Final   Value: 11/27/2013 08:25     Performed at [facility]   Culture     Final   Value:        BLOOD CULTURE RECEIVED NO GROWTH TO DATE CULTURE WILL BE HELD FOR 5 DAYS BEFORE ISSUING A FINAL NEGATIVE REPORT     Performed at [facility]   Report Status PENDING   Incomplete  CULTURE, BLOOD (ROUTINE X 2)     Status: None   Collection Time    11/27/13  1:57 AM      Result Value Ref Range Status   Specimen Description BLOOD CENTRAL LINE   Final   Special Requests     Final   Value: RIGHT  BOTTLES DRAWN AEROBIC AND ANAEROBIC 5CC EA PATIENT ON FOLLOWING VANCOMYCIN,ZOSYN   Culture  Setup Time     Final   Value: 11/27/2013 08:25     Performed at [facility]   Culture     Final   Value:  BLOOD CULTURE RECEIVED NO GROWTH TO DATE CULTURE WILL BE HELD FOR 5 DAYS BEFORE ISSUING A FINAL NEGATIVE REPORT     [facility]   Report Status PENDING   Incomplete  MRSA PCR SCREENING     Status: None   Collection Time    11/27/13  3:17 AM      Result Value Ref Range Status   MRSA by PCR NEGATIVE  NEGATIVE Final   Comment:            The GeneXpert MRSA Assay (FDA     approved for NASAL specimens     only), is one component of a     comprehensive MRSA colonization     surveillance program. It is not     intended to diagnose MRSA     infection nor to guide or     monitor treatment for     MRSA infections.  CULTURE, RESPIRATORY (NON-EXPECTORATED)     Status: None   Collection Time    11/28/13  1:25 AM      Result Value Ref Range Status   Specimen Description TRACHEAL ASPIRATE   Final   Special Requests Normal   Final   Gram Stain     Final   Value: ABUNDANT WBC PRESENT, PREDOMINANTLY PMN     RARE SQUAMOUS EPITHELIAL CELLS PRESENT     FEW YEAST     FEW GRAM POSITIVE COCCI     IN PAIRS   Culture     Final   Value: MODERATE  STAPHYLOCOCCUS AUREUS     Note: RIFAMPIN AND GENTAMICIN SHOULD NOT BE USED AS SINGLE DRUGS FOR TREATMENT OF STAPH INFECTIONS.     MODERATE CANDIDA ALBICANS     Performed at [facility]   Report Status PENDING   Incomplete    Medical History: Past Medical History  Diagnosis Date  . Alzheimer's disease 01/20/2013  . Essential hypertension, benign 01/20/2013  . Osteoporosis, unspecified 01/20/2013  . Allergic rhinitis, cause unspecified 01/20/2013  . Femur fracture     Remote, Details Unknown  . Facial fracture     Remote, details unknown    Medications:  Prescriptions prior to admission  Medication Sig Dispense Refill  . amLODipine (NORVASC) 10 MG tablet Take 10 mg by mouth daily.      Marland Kitchen. docusate sodium (COLACE) 100 MG capsule Take 100 mg by mouth daily.      Marland Kitchen. escitalopram (LEXAPRO) 20 MG tablet Take 20 mg by mouth daily.      Marland Kitchen. lactose free nutrition (BOOST) LIQD Take 237 mLs by mouth 3 (three) times daily with meals.      Marland Kitchen. loratadine (CLARITIN) 10 MG tablet Take 10 mg by mouth daily.      . metoprolol tartrate (LOPRESSOR) 25 MG tablet Take 25 mg by mouth daily.      . Multiple Vitamin (MULTIVITAMIN WITH MINERALS) TABS tablet Take 1 tablet by mouth daily.      Marland Kitchen. olopatadine (PATANOL) 0.1 % ophthalmic solution Place 1 drop into both eyes 2 (two) times daily.      . Vitamin D, Ergocalciferol, (DRISDOL) 50000 UNITS CAPS capsule Take 50,000 Units by mouth every 30 (thirty) days. Take on the 20th of each month       Scheduled:  . cefTRIAXone (ROCEPHIN)  IV  1 g Intravenous Q24H  . chlorhexidine  15 mL Mouth/Throat BID  . docusate  100 mg Per Tube Daily  . escitalopram  20 mg  Oral Daily  . feeding supplement (VITAL AF 1.2 CAL)  1,000 mL Per Tube Q24H  . heparin  5,000 Units Subcutaneous 3 times per day  . loratadine  10 mg Oral Daily  . olopatadine  1 drop Both Eyes BID  . potassium chloride  60 mEq Oral Q6H  . [START ON 12/01/2013] Vitamin D (Ergocalciferol)  50,000 Units Oral  Q30 days   Infusions:   Assessment: 80 y.o female presented to the ED on 3/16 with End-Stage Alzheimer's Disease, ward of state, transferred from nursing home with AMS, hypotension likely secondary to dehydration secondary to UTI.  Currently on IV Ceftriaxone.  Now to start IV Vancomycin for probable aspiration pneumonia.   SCr 1.38 (decreased from 1.9),estimated CrCl ~ 27 ml/min 3/17 Tracheal aspirate culture grew moderate staph aureus.  3/16 Urine culture +E. Coli (senstive to ceftriaxone) 3/16 Blood CX x 2  NGTD MRSA PCR negative   Goal of Therapy:  Vancomycin trough level 15-20 mcg/ml  Plan:  Vancomycin 750 mg IV q24h.  Monitor clinical status, renal function and culture results.  Monitor vancomycin steady state trough if needed per protocol.  Noah Delaine, RPh Clinical Pharmacist Pager: [phone] 11/30/2013,12:30 PM

## 2013-11-30 NOTE — Progress Notes (Addendum)
NUTRITION FOLLOW-UP  DOCUMENTATION CODES Per approved criteria  -Severe malnutrition in the context of chronic illness   Pt meets criteria for severe MALNUTRITION in the context of chronic illness as evidenced by intake of <75% x at least 1 month and weight change from 153 - 139 lb (9%) x 1 month at SNF.  INTERVENTION: Continue Vital AF 1.2 formula via NGT, increase by 10 ml q 4 hours to goal of 55 ml/hr. Goal regimen will provide: 1584 kcal, 99 grams protein, 1070 ml free water. Discontinue MVI and 30 ml Prostat liquid protein. Discontinue PEP-UP orders. Monitor magnesium, potassium, and phosphorus daily for at least 3 days, MD to replete as needed, as pt is at risk for refeeding syndrome given prolonged poor oral intake and malnutrition. Discussed with PA-student, Elease HashimotoJacqueline Kocot. RD to continue to follow along for nutrition care plan.  NUTRITION DIAGNOSIS: Inadequate oral intake related to inability to eat as evidenced by NPO status. Ongoing.  Goal: Intake to meet >90% of estimated nutrition needs.  Monitor:  TF tolerance/adequacy, weight trend, labs, ability to take PO's  ASSESSMENT: Patient is an 80 year old female with End-Stage Alzheimer's Disease, ward of state, transferred from nursing home with AMS, hypotension likely 2/2 worsening AMS 2/2 UTI leading to dehydration. Intubated in ED on 3/16.  Pt intubated 3/16 - 3/17. TF initiated during that time. RN reports that pt was admitted to [facility] receiving Vital AF 1.2 at 20 ml/hr, and she increased to 40 ml/hr via MD request this morning. Tolerating well. Pt obtunded and not appropriate for oral nutrition. RD to update enteral nutrition orders to better meet nutrition needs at this time.  Phosphorus is low at 1.8. Potassium low at 2.8.  Reviewed SNF chart, per chart "pt has experience weight loss since Jan and was started on Boost TID with meals, in addition to prostat and remeron that was previously ordered." Pt had 9% wt loss x 1  month at SNF during this time. Note from 3/4 stated that team was asking POA if tube feedings should be considered. Discussed these findings with PA-Student, Annice PihJackie and question if patient is experiencing refeeding syndrome with current  TF.   Height: Ht Readings from Last 1 Encounters:  11/26/13 5\' 5"  (1.651 m)    Weight: Wt Readings from Last 1 Encounters:  11/30/13 151 lb 7.3 oz (68.7 kg)  Admit wt 150 lb  BMI:  Body mass index is 25.2 kg/(m^2). Normal weight  Estimated Nutritional Needs: Kcal: 1500 - 1650 Protein: 85 - 100 gm Fluid: 1.7 - 1.9 L  Skin: right foot open wound  Diet Order: NPO  EDUCATION NEEDS: -No education needs identified at this time   Intake/Output Summary (Last 24 hours) at 11/30/13 0855 Last data filed at 11/30/13 0030  Gross per 24 hour  Intake   1245 ml  Output   1275 ml  Net    -30 ml    Last BM: 3/19   Labs:   Recent Labs Lab 11/28/13 0344  11/28/13 1820 11/29/13 0320 11/29/13 1250  NA 154*  < > 152* 152* 146  K 2.9*  < > 4.0 3.2* 3.7  CL 123*  < > 118* 118* 113*  CO2 12*  < > 15* 19 16*  BUN 65*  < > 52* 50* 51*  CREATININE 1.99*  < > 1.60* 1.64* 1.92*  CALCIUM 7.2*  < > 7.0* 7.3* 6.8*  MG 2.2  --  2.1 2.1  --   PHOS 2.5  --  2.1* 1.8*  --   GLUCOSE 153*  < > 236* 114* 200*  < > = values in this interval not displayed.  CBG (last 3)   Recent Labs  11/30/13 0144 11/30/13 0410 11/30/13 0809  GLUCAP 82 70 93    Scheduled Meds: . cefTRIAXone (ROCEPHIN)  IV  1 g Intravenous Q24H  . chlorhexidine  15 mL Mouth/Throat BID  . docusate  100 mg Per Tube Daily  . escitalopram  20 mg Oral Daily  . feeding supplement (PRO-STAT SUGAR FREE 64)  30 mL Per Tube BID  . feeding supplement (VITAL AF 1.2 CAL)  1,000 mL Per Tube Q24H  . heparin  5,000 Units Subcutaneous 3 times per day  . insulin aspart  2-6 Units Subcutaneous 6 times per day  . insulin aspart  5 Units Subcutaneous 6 times per day  . insulin glargine  30 Units  Subcutaneous Q24H  . loratadine  10 mg Oral Daily  . multivitamin with minerals  1 tablet Oral Daily  . olopatadine  1 drop Both Eyes BID  . [START ON 12/01/2013] Vitamin D (Ergocalciferol)  50,000 Units Oral Q30 days    Continuous Infusions: . dextrose    .  sodium bicarbonate infusion 1/4 NS 1000 mL 75 mL/hr at 11/29/13 2257    Jarold Motto MS, RD, LDN Inpatient Registered Dietitian Pager: [phone] After-hours pager: [phone]

## 2013-12-01 DIAGNOSIS — I214 Non-ST elevation (NSTEMI) myocardial infarction: Secondary | ICD-10-CM

## 2013-12-01 DIAGNOSIS — E43 Unspecified severe protein-calorie malnutrition: Secondary | ICD-10-CM

## 2013-12-01 DIAGNOSIS — E872 Acidosis, unspecified: Secondary | ICD-10-CM

## 2013-12-01 DIAGNOSIS — R578 Other shock: Secondary | ICD-10-CM

## 2013-12-01 DIAGNOSIS — R7309 Other abnormal glucose: Secondary | ICD-10-CM

## 2013-12-01 DIAGNOSIS — A419 Sepsis, unspecified organism: Secondary | ICD-10-CM

## 2013-12-01 LAB — CBC
HCT: 28.9 % — ABNORMAL LOW (ref 36.0–46.0)
Hemoglobin: 9.4 g/dL — ABNORMAL LOW (ref 12.0–15.0)
MCH: 30.7 pg (ref 26.0–34.0)
MCHC: 32.5 g/dL (ref 30.0–36.0)
MCV: 94.4 fL (ref 78.0–100.0)
PLATELETS: 100 10*3/uL — AB (ref 150–400)
RBC: 3.06 MIL/uL — AB (ref 3.87–5.11)
RDW: 14.3 % (ref 11.5–15.5)
WBC: 16.4 10*3/uL — ABNORMAL HIGH (ref 4.0–10.5)

## 2013-12-01 LAB — COMPREHENSIVE METABOLIC PANEL
ALT: 31 U/L (ref 0–35)
AST: 41 U/L — AB (ref 0–37)
Albumin: 1.5 g/dL — ABNORMAL LOW (ref 3.5–5.2)
Alkaline Phosphatase: 71 U/L (ref 39–117)
BUN: 35 mg/dL — ABNORMAL HIGH (ref 6–23)
CO2: 22 meq/L (ref 19–32)
Calcium: 7.7 mg/dL — ABNORMAL LOW (ref 8.4–10.5)
Chloride: 115 mEq/L — ABNORMAL HIGH (ref 96–112)
Creatinine, Ser: 1.21 mg/dL — ABNORMAL HIGH (ref 0.50–1.10)
GFR calc non Af Amer: 39 mL/min — ABNORMAL LOW (ref >90)
GFR, EST AFRICAN AMERICAN: 45 mL/min — AB (ref >90)
Glucose, Bld: 228 mg/dL — ABNORMAL HIGH (ref 70–99)
Potassium: 3.5 mEq/L — ABNORMAL LOW (ref 3.7–5.3)
Sodium: 150 mEq/L — ABNORMAL HIGH (ref 137–147)
Total Bilirubin: <0.2 mg/dL — ABNORMAL LOW (ref 0.3–1.2)
Total Protein: 4.9 g/dL — ABNORMAL LOW (ref 6.0–8.3)

## 2013-12-01 LAB — CULTURE, RESPIRATORY W GRAM STAIN: Special Requests: NORMAL

## 2013-12-01 LAB — MAGNESIUM: Magnesium: 1.9 mg/dL (ref 1.5–2.5)

## 2013-12-01 LAB — CULTURE, RESPIRATORY

## 2013-12-01 LAB — GLUCOSE, CAPILLARY
GLUCOSE-CAPILLARY: 163 mg/dL — AB (ref 70–99)
Glucose-Capillary: 198 mg/dL — ABNORMAL HIGH (ref 70–99)
Glucose-Capillary: 213 mg/dL — ABNORMAL HIGH (ref 70–99)
Glucose-Capillary: 218 mg/dL — ABNORMAL HIGH (ref 70–99)

## 2013-12-01 LAB — CLOSTRIDIUM DIFFICILE BY PCR: CDIFFPCR: NEGATIVE

## 2013-12-01 MED ORDER — MAGIC MOUTHWASH
5.0000 mL | Freq: Three times a day (TID) | ORAL | Status: DC
  Administered 2013-12-01: 5 mL via ORAL
  Filled 2013-12-01 (×6): qty 5

## 2013-12-01 MED ORDER — LORAZEPAM 2 MG/ML PO CONC: 0.5000 mg | ORAL | Status: DC | PRN

## 2013-12-01 MED ORDER — MORPHINE SULFATE (CONCENTRATE) 10 MG /0.5 ML PO SOLN: 5.0000 mg | ORAL | Status: DC | PRN

## 2013-12-01 MED ORDER — ACETAMINOPHEN 650 MG RE SUPP
650.0000 mg | Freq: Four times a day (QID) | RECTAL | Status: DC | PRN
Start: 2013-12-01 — End: 2013-12-02

## 2013-12-01 MED ORDER — LORAZEPAM 2 MG/ML PO CONC: 5.0000 mg | ORAL | Status: AC | PRN

## 2013-12-01 MED ORDER — MORPHINE SULFATE (CONCENTRATE) 10 MG /0.5 ML PO SOLN: 5.0000 mg | ORAL | Status: AC | PRN

## 2013-12-01 NOTE — Clinical Social Work Note (Addendum)
[facility] and will be transported by EMS Saturday morning to facility. Patient is a ward of the state and CSW has coordinated discharge plan with hospice facility liaison and patient's guardians Marcelino DusterMichelle and Ewing SchleinJanise (contacts listed on facesheet). Guardians stated that they do not want [facility] as they feel she will receive better care at a residential hospice facility. CSW has left handoff report for weekend coverage.  Roddie McBryant Calyssa Zobrist, Wallula[place]CSWA, [place], [phone]

## 2013-12-01 NOTE — Discharge Summary (Addendum)
Physician Discharge Summary  Tiffany LegacyKatherine M Mckinney ZOX:[id number] DOB: April 15, 1923 DOA: 11/26/2013  PCP: No primary provider on file.  Admit date: 11/26/2013 Discharge date: 12/01/2013  Time spent: 40 minutes  Recommendations for Outpatient Follow-up:  1. Patient is full comfort. Being discharged to a hospice home  Discharge Diagnoses:  Active Problems:   Acute kidney failure, unspecified   Altered mental status   Failure to thrive   Hyperglycemia   Hypernatremia   Metabolic acidosis   Elevated troponin   Hypovolemic shock   Anemia   Infection of urinary tract   Sepsis   Discharge Condition: Full comfort  Diet recommendation: Soft diet for comfort  Filed Weights   11/28/13 0230 11/30/13 0403 12/01/13 0409  Weight: 66.1 kg (145 lb 11.6 oz) 68.7 kg (151 lb 7.3 oz) 67.6 kg (149 lb 0.5 oz)    History of present illness:  Patient was admitted to the hospital on 3/15 with  Altered mental status requiring intubation for airway protection. She was hyperglycemic with FBS greater than 300, hypernatremia, in afib, and was hypotensive consistent with SiRS/Sepsis. Her sodium was corrected and she was treated for UTI , hypovolemic shock, possible demand ischemia with NSTEMI and sepsis. She improved enough to extubate on 3/17 but in general has remained intermittently responsive at best.    Hospital Course:   Sepsis  -Sepsis syndrome likely secondary to UTI and Pneumonia.  -On the day of admission patient started on Zosyn and vancomycin, switched to Rocephin.  -Escherichia coli UTI and staph aureus pneumonia.   Pneumonia  -Left lower lobe pneumonia, tracheal aspirate shows staph aureus and Candida.  -Pneumonia HCAP versus aspiration pneumonia.  -Started on vancomycin and Diflucan. On discharge antibiotics discontinued.   UTI  -Escherichia coli UTI, patient currently on ceftriaxone.   Hypernatremia  -Severe hypernatremia secondary to multiple etiologies including severe dehydration  and failure to thrive.  -She presented to the hospital with sodium level of 170, corrected sodium level is 173. -She is on bicarbonate drip and he acidosis resolved, I will discontinue the IV antibiotics.  Elevated troponin  -Non-ST segment elevation MI in setting of sepsis and dehydration.  -Likely type II non-STEMI secondary to demand ischemia, start aspirin and beta blockers when BP tolerates.   Metabolic acidosis  -Likely multifactorial secondary to acute renal failure and sepsis.  -Bicarbonate was 13 upon admission, currently is 22.  -This is resolved, Asian is on bicarbonate drip currently, we'll discontinue IV fluids.   Goals of care  -Patient has advanced dementia, dysphagia and likely aspiration pneumonia.  -Very poor prognosis, palliative and hospice care consulted. -After discussion with the guardian Mrs. Lynnell DikeJenise Davis patient is for full comfort. -[facility] home and his flow and has no available spots, patient will go back to her nursing facility with hospice/full comfort care. -Start soft diet for comfort.   Procedures:  ETT 3/16>>>3/17   R [place] CVC 3/16>>>3/17   R Radial Arterial Line 3/17>>>3/17   Foley Catheter 3/16   Consultations:  Patient was in the PCCM till 11/29/2013.    Discharge Exam: Filed Vitals:   12/01/13 0409  BP: 117/74  Pulse: 90  Temp: 99.1 F (37.3 C)  Resp: 24   General: Alert and awake, oriented x3, not in any acute distress. HEENT: anicteric sclera, pupils reactive to light and accommodation, EOMI CVS: S1-S2 clear, no murmur rubs or gallops Chest: clear to auscultation bilaterally, no wheezing, rales or rhonchi Abdomen: soft nontender, nondistended, normal bowel sounds, no  organomegaly Extremities: no cyanosis, clubbing or edema noted bilaterally Neuro: Cranial nerves II-XII intact, no focal neurological deficits  Discharge Instructions  Discharge Orders   Future Orders Complete By Expires   Diet - low sodium  heart healthy  As directed    Increase activity slowly  As directed        Medication List    STOP taking these medications       amLODipine 10 MG tablet  Commonly known as:  NORVASC     cefTRIAXone 2 G injection  Commonly known as:  ROCEPHIN     docusate sodium 100 MG capsule  Commonly known as:  COLACE     loratadine 10 MG tablet  Commonly known as:  CLARITIN     memantine 10 MG tablet  Commonly known as:  NAMENDA      TAKE these medications       escitalopram 20 MG tablet  Commonly known as:  LEXAPRO  Take 20 mg by mouth daily.     lactose free nutrition Liqd  Take 237 mLs by mouth 3 (three) times daily with meals.     LORazepam 2 MG/ML concentrated solution  Commonly known as:  ATIVAN  Take 2.5 mLs (5 mg total) by mouth every 4 (four) hours as needed for anxiety.     metoprolol tartrate 25 MG tablet  Commonly known as:  LOPRESSOR  Take 25 mg by mouth daily.     morphine CONCENTRATE 10 mg / 0.5 ml concentrated solution  Take 0.25 mLs (5 mg total) by mouth every 2 (two) hours as needed for severe pain or shortness of breath.     multivitamin with minerals Tabs tablet  Take 1 tablet by mouth daily.     olopatadine 0.1 % ophthalmic solution  Commonly known as:  PATANOL  Place 1 drop into both eyes 2 (two) times daily.     Vitamin D (Ergocalciferol) 50000 UNITS Caps capsule  Commonly known as:  DRISDOL  Take 50,000 Units by mouth every 30 (thirty) days. Take on the 20th of each month       No Known Allergies    The results of significant diagnostics from this hospitalization (including imaging, microbiology, ancillary and laboratory) are listed below for reference.    Significant Diagnostic Studies: Dg Cervical Spine 1 View  11/27/2013   CLINICAL DATA:  Central line placement.  EXAM: DG CERVICAL SPINE - 1 VIEW  COMPARISON:  None.  FINDINGS: Right IJ catheter projects retrograde to the level of the base of the skull. Endotracheal tube noted.   IMPRESSION: Right IJ approach central venous catheter tip is at the level of the skullbase.  Critical Value/emergent results were called by telephone at the time of interpretation on 11/27/2013 at 1:52 AM to Dr. Derwood Kaplan , who verbally acknowledged these results.   Electronically Signed   By: Drusilla Kanner M.D.   On: 11/27/2013 01:52   Ct Head Wo Contrast  11/27/2013   CLINICAL DATA:  Altered mental status  EXAM: CT HEAD WITHOUT CONTRAST  TECHNIQUE: Contiguous axial images were obtained from the base of the skull through the vertex without intravenous contrast.  COMPARISON:  Prior CT from 01/30/2010  FINDINGS: Advanced age-related atrophy with chronic microvascular ischemic disease is present, similar as compared to prior CT from 01/30/2010.  There is no acute intracranial hemorrhage or infarct. No mass lesion or midline shift. Gray-white matter differentiation is well maintained. Ventricles are normal in size without evidence of hydrocephalus. CSF  containing spaces are within normal limits. No extra-axial fluid collection. Tiny remote lacunar infarct noted within the left pons.  The calvarium is intact.  Orbital soft tissues are within normal limits. Postsurgical changes noted about both globes.  Probable retention cyst noted within the right maxillary sinus. Otherwise, the paranasal sinuses and mastoid air cells are well pneumatized and free of fluid.  Scalp soft tissues are unremarkable.  IMPRESSION: 1. No acute intracranial process. 2. Stable atrophy with chronic microvascular ischemic disease.   Electronically Signed   By: Rise Mu M.D.   On: 11/27/2013 01:09   Dg Chest Port 1 View  11/28/2013   CLINICAL DATA:  Reassess endotracheal tube position  EXAM: PORTABLE CHEST - 1 VIEW  COMPARISON:  DG CHEST 1V PORT dated 11/27/2013  FINDINGS: The endotracheal tube tip lies approximately 4 cm above the crotch of the carina. The right subclavian venous catheter tip lies in the region of the  midportion of the SVC. The esophagogastric tube tip and proximal port project off the inferior margin of the film.  The right lung is adequately inflated. There is no focal infiltrate. On the left the retrocardiac region is dense consistent with minimal atelectasis. No alveolar edema or infiltrate is demonstrated.  IMPRESSION: 1. The support tubes and lines appear to be in appropriate position. 2. Left lower lobe atelectasis or infiltrate is more conspicuous today resulting in increased obscuration of the hemidiaphragm.   Electronically Signed   By: David  [place]   On: 11/28/2013 08:03   Dg Chest Port 1 View  11/27/2013   CLINICAL DATA:  Central line placement.  EXAM: PORTABLE CHEST - 1 VIEW  COMPARISON:  DG CHEST 1V PORT dated 11/27/2013  FINDINGS: Endotracheal tube noted in stable position. Previously identified central line has been redirected, its tip is projected over the superior vena cava. Persistent left base atelectasis versus infiltrate noted. Stable nodular density versus costochondral calcification right lung base. No pleural effusion or pneumothorax. Heart size is stable.  IMPRESSION: 1. Interim repositioning of central line, its tip is in the superior vena cava.  2. Left basilar atelectasis versus mild infiltrate.   Electronically Signed   By: Maisie Fus  Register   On: 11/27/2013 02:22   Dg Chest Port 1 View  11/27/2013   CLINICAL DATA:  Placement central venous catheter.  EXAM: PORTABLE CHEST - 1 VIEW  COMPARISON:  DG CHEST 1V PORT dated 11/26/2013  FINDINGS: Endotracheal tube noted with tip 4.5 cm above the carina. Central line on the right is noted looping in the supraclavicular region and coursing superiorly, most likely in the right jugular vein. Mild left basilar atelectasis present. Lungs are otherwise clear. No pneumothorax. Heart size appropriate vascularity normal. No acute bony abnormality identified.  IMPRESSION: 1. Right central line noted looping in the subclavicular region and coursing  up the distribution of the right internal jugular vein. Repositioning should be considered. This report was phoned to the patient's caregiver at the time of study. Endotracheal tube noted in good anatomic position. 2. Mild left base subsegmental atelectasis.   Electronically Signed   By: Maisie Fus  Register   On: 11/27/2013 01:54   Dg Chest Port 1 View  11/26/2013   CLINICAL DATA:  Altered mental status, respiratory distress  EXAM: PORTABLE CHEST - 1 VIEW  COMPARISON:  Prior radiograph from 10/03/2008  FINDINGS: The cardiac and mediastinal silhouettes are stable in size and contour, and remain within normal limits.  The lungs are mildly hypoinflated. Minimal linear opacity at  the left lung base most likely reflects atelectasis. No airspace consolidation, pleural effusion, or pulmonary edema is identified. There is no pneumothorax.  No acute osseous abnormality identified.  IMPRESSION: Mild left basal atelectasis. No acute cardiopulmonary abnormality identified.   Electronically Signed   By: Rise Mu M.D.   On: 11/26/2013 23:30   Dg Abd Portable 1v  11/29/2013   CLINICAL DATA:  NG tube placement.  EXAM: PORTABLE ABDOMEN - 1 VIEW  COMPARISON:  None.  FINDINGS: NG tube tip is in the body of the stomach with the tip in the proximal stomach. Visualized upper abdominal bowel gas pattern is unremarkable.  IMPRESSION: NG tube tip in the body of the stomach.   Electronically Signed   By: Charlett Nose M.D.   On: 11/29/2013 17:44    Microbiology: Recent Results (from the past 240 hour(s))  URINE CULTURE     Status: None   Collection Time    11/27/13 12:08 AM      Result Value Ref Range Status   Specimen Description URINE, CLEAN CATCH   Final   Special Requests NONE   Final   Culture  Setup Time     Final   Value: 11/27/2013 00:33     Performed at [facility] Count     Final   Value: >=100,000 COLONIES/ML     Performed at [facility]   Culture     Final   Value:  ESCHERICHIA COLI     Performed at [facility]   Report Status 11/29/2013 FINAL   Final   Organism ID, Bacteria ESCHERICHIA COLI   Final  CULTURE, BLOOD (ROUTINE X 2)     Status: None   Collection Time    11/27/13 12:43 AM      Result Value Ref Range Status   Specimen Description BLOOD RIGHT ARTERIAL   Final   Special Requests BOTTLES DRAWN AEROBIC ONLY 4CC   Final   Culture  Setup Time     Final   Value: 11/27/2013 08:25     Performed at [facility]   Culture     Final   Value:        BLOOD CULTURE RECEIVED NO GROWTH TO DATE CULTURE WILL BE HELD FOR 5 DAYS BEFORE ISSUING A FINAL NEGATIVE REPORT     Performed at [facility]   Report Status PENDING   Incomplete  CULTURE, BLOOD (ROUTINE X 2)     Status: None   Collection Time    11/27/13  1:57 AM      Result Value Ref Range Status   Specimen Description BLOOD CENTRAL LINE   Final   Special Requests     Final   Value: RIGHT [place] BOTTLES DRAWN AEROBIC AND ANAEROBIC 5CC EA PATIENT ON FOLLOWING VANCOMYCIN,ZOSYN   Culture  Setup Time     Final   Value: 11/27/2013 08:25     Performed at [facility]   Culture     Final   Value:        BLOOD CULTURE RECEIVED NO GROWTH TO DATE CULTURE WILL BE HELD FOR 5 DAYS BEFORE ISSUING A FINAL NEGATIVE REPORT     Performed at [facility]   Report Status PENDING   Incomplete  MRSA PCR SCREENING     Status: None   Collection Time    11/27/13  3:17 AM      Result Value Ref Range Status   MRSA  by PCR NEGATIVE  NEGATIVE Final   Comment:            The GeneXpert MRSA Assay (FDA     approved for NASAL specimens     only), is one component of a     comprehensive MRSA colonization     surveillance program. It is not     intended to diagnose MRSA     infection nor to guide or     monitor treatment for     MRSA infections.  CULTURE, RESPIRATORY (NON-EXPECTORATED)     Status: None   Collection Time    11/28/13  1:25 AM      Result Value Ref Range Status    Specimen Description TRACHEAL ASPIRATE   Final   Special Requests Normal   Final   Gram Stain     Final   Value: ABUNDANT WBC PRESENT, PREDOMINANTLY PMN     RARE SQUAMOUS EPITHELIAL CELLS PRESENT     FEW YEAST     FEW GRAM POSITIVE COCCI     IN PAIRS   Culture     Final   Value: MODERATE STAPHYLOCOCCUS AUREUS     Note: RIFAMPIN AND GENTAMICIN SHOULD NOT BE USED AS SINGLE DRUGS FOR TREATMENT OF STAPH INFECTIONS.     MODERATE CANDIDA ALBICANS     Performed at [facility]   Report Status 12/01/2013 FINAL   Final   Organism ID, Bacteria STAPHYLOCOCCUS AUREUS   Final  CLOSTRIDIUM DIFFICILE BY PCR     Status: None   Collection Time    11/30/13  9:55 AM      Result Value Ref Range Status   C difficile by pcr NEGATIVE  NEGATIVE Final     Labs: Basic Metabolic Panel:  Recent Labs Lab 11/27/13 1400 11/27/13 1549 11/28/13 0344  11/28/13 1820 11/29/13 0320 11/29/13 1250 11/30/13 0745 12/01/13 0758  NA 165*  --  154*  < > 152* 152* 146 148* 150*  K 3.4*  --  2.9*  < > 4.0 3.2* 3.7 2.6* 3.5*  CL >130*  --  123*  < > 118* 118* 113* 111 115*  CO2 13*  --  12*  < > 15* 19 16* 22 22  GLUCOSE 163*  --  153*  < > 236* 114* 200* 99 228*  BUN 83*  --  65*  < > 52* 50* 51* 38* 35*  CREATININE 2.45*  --  1.99*  < > 1.60* 1.64* 1.92* 1.38* 1.21*  CALCIUM 7.7*  --  7.2*  < > 7.0* 7.3* 6.8* 7.2* 7.7*  MG  --  2.5 2.2  --  2.1 2.1  --   --  1.9  PHOS  --  3.4 2.5  --  2.1* 1.8*  --   --   --   < > = values in this interval not displayed. Liver Function Tests:  Recent Labs Lab 11/26/13 2354 11/28/13 0344 12/01/13 0758  AST 35 32 41*  ALT 34 25 31  ALKPHOS 96 90 71  BILITOT 0.6 0.3 <0.2*  PROT 7.9 5.6* 4.9*  ALBUMIN 3.3* 2.2* 1.5*   No results found for this basename: LIPASE, AMYLASE,  in the last 168 hours No results found for this basename: AMMONIA,  in the last 168 hours CBC:  Recent Labs Lab 11/26/13 2354 11/27/13 0150 11/28/13 0344 12/01/13 0758  WBC 32.4* 23.9*  34.0* 16.4*  NEUTROABS  --   --  31.9*  --   HGB  16.4* 11.3* 11.4* 9.4*  HCT 50.6* 36.3 36.1 28.9*  MCV 98.1 101.1* 98.1 94.4  PLT 204 147* 136* 100*   Cardiac Enzymes:  Recent Labs Lab 11/26/13 2356 11/27/13 0327 11/27/13 0728 11/27/13 1400  TROPONINI 0.58* 0.58* 0.48* 0.58*   BNP: BNP (last 3 results) No results found for this basename: PROBNP,  in the last 8760 hours CBG:  Recent Labs Lab 11/30/13 2059 12/01/13 0028 12/01/13 0357 12/01/13 0814 12/01/13 1200  GLUCAP 230* 198* 218* 213* 163*       Signed:  Johany Hansman A  [facility] 12/01/2013, 12:06 PM

## 2013-12-01 NOTE — Clinical Social Work Note (Addendum)
[facility] [place] [facility] [place] [facility] [place]  [place]  CSW referred Vinnie LangtonGretchen to unit CSW and provided phone number.  Vickii PennaGina Dakhari Zuver, LCSWA [phone]  Clinical Social Work

## 2013-12-01 NOTE — Progress Notes (Signed)
Attempted to contact Beckie SaltsMichelle Juchactz and Lynnea FerrierVanessa Funderburk for consent for PICC procedure.  I left messages with both women and will await a call back for consent.  Notified staff RN

## 2013-12-01 NOTE — Progress Notes (Signed)
SLP Cancellation Note  Patient Details Name: Tiffany Mckinney MRN: [id number] DOB: 02-08-1923   Cancelled treatment:       Reason Eval/Treat Not Completed: Patient's level of consciousness. Pt has transitioned to comfort care and [facility]. SLP will defer attempts at comfort feeding as pt still not alert. Will sign off.   Blayn Whetsell, Riley NearingBonnie Caroline 12/01/2013, 3:39 PM

## 2013-12-01 NOTE — Consult Note (Signed)
[facility] Liaison: Received referral from CSW. [facility]. Will follow up with CSW if availability for this patient changes over weekend. Thank you. Forrestine Himva Jeriann Sayres LCsW [phone]

## 2013-12-01 NOTE — Consult Note (Addendum)
Patient ON:GEXBMWUXL:Delayza Margaretha GlassingM Willig      DOB: [birth year]      KGM:[id number]     Consult Note from the Palliative Medicine Team at [facility]    Consult Requested by: Dr. Arthor CaptainElmahi    PCP: No primary provider on file. Reason for Consultation: GOC     Phone Number:None Related recommendations. Assessment of patients Current state: 80 yr old white female who is a ward of the state ( currently under the supervision of Beckie SaltsMichelle Juchactz [phone]), and Lynnell DikeJenise Davis [phone]).  Patient was admitted to the hospital on 3/15 with Altered mental status requiring intubation for airway protection.  She was hyperglycemic with FBS greater than 300, hypernatremia, in afib, and was hypotensive consistent with SiRS/Sepsis.  Her sodium was corrected and she was treated for UTI , hypovolemic shock, possible demand ischemia with NSTEMI and sepsis.  She improved enough to extubate on 3/17 but in general has remained intermittently responsive at best.  Her overall status, is severely malnourished with sunken eyes, edematous extremities, Fast Score 7 dementia level- speaking jibberish.  Reviewed the case with the Supervisor for Beckie SaltsMichelle Juchactz,  [employer] Davis.  Ms. Earlene PlaterDavis is unable to appear in person to evaluate the patient.  She was informed of the hospital coures and the patient's minimal response to treatment.  Overall, prognosis is very poor for this patient.  If she is unable to eat she has days of life left.  Ms. Earlene PlaterDavis felt at this time that full comfort care would be appropriate which I outlined as discontinuation of ABx and tube feedings with comfort feedings only.   She was open to Hospice Facility placement.  I have updated Dr. Arthor CaptainElmahi and SW will initiate search for a hospice bed.  Goals of Care: 1.  Code Status: DNR  2. Scope of Treatment: Full comfort.   4. Disposition: Hospice facility placement   3. Symptom Management:   1. Anxiety/Agitation: Ativan 0.5 mg sl q 4 hrs prn 2. Pain:Roxanol 5 mg sl q 2  hrs prn for pain or dyspnea 3. Bowel Regimen:monitor 4. Fever : rectal suppository 5. Oral lacerations: gentle mouth care , add magic mouth wash if severe pain could use lidocaine in MM. 6. Terminal Secretions: not a current issue but could add atropine or scopolamine if needed. 7. Dysphagia/ sever protein calorie malnutrition: dc tube feeding feed for comfort if able. 8. Hematuria /UTI E coli: dc antibiotics treat symptomatically. 9. Metabolic encephalopathy: treat symptomatically  4. Psychosocial: unknown.  Patient is a ward of the state. [facility]  5. Spiritual: Patient with advanced dementia unable to obtain data.   Brief HPI: 80 yr old white [facility] with altered mental status, sepsis, afib found to have UTI. Asked to assist with Goals of Care.   IEP:PIRJJOROS:Unable due to advanced dementia    PMH:  Past Medical History  Diagnosis Date  . Alzheimer's disease 01/20/2013  . Essential hypertension, benign 01/20/2013  . Osteoporosis, unspecified 01/20/2013  . Allergic rhinitis, cause unspecified 01/20/2013  . Femur fracture     Remote, Details Unknown  . Facial fracture     Remote, details unknown     ACZ:YSAYTKZPSH:History reviewed. No pertinent past surgical history. I have reviewed the FH and SH and  If appropriate update it with new information. No Known Allergies Scheduled Meds: . cefTRIAXone (ROCEPHIN)  IV  1 g Intravenous Q24H  . chlorhexidine  15 mL Mouth/Throat BID  . docusate  100 mg Per Tube Daily  . escitalopram  20 mg Oral Daily  . feeding supplement (VITAL AF 1.2 CAL)  1,000 mL Per Tube Q24H  . heparin  5,000 Units Subcutaneous 3 times per day  . loratadine  10 mg Oral Daily  . olopatadine  1 drop Both Eyes BID  . vancomycin  750 mg Intravenous Q24H  . Vitamin D (Ergocalciferol)  50,000 Units Oral Q30 days   Continuous Infusions:  PRN Meds:.acetaminophen (TYLENOL) oral liquid 160 mg/5 mL, dextrose    BP 117/74   Pulse 90  Temp(Src) 99.1 F (37.3 C) (Axillary)  Resp 24  Ht 5\' 5"  (1.651 m)  Wt 67.6 kg (149 lb 0.5 oz)  BMI 24.80 kg/m2  SpO2 96%   PPS: 10%   Intake/Output Summary (Last 24 hours) at 12/01/13 1610 Last data filed at 12/01/13 9604  Gross per 24 hour  Intake      0 ml  Output   1900 ml  Net  -1900 ml   LBM: 3/19                       Physical Exam:  General: difficult to awaken, but with vigorous interaction did open eye, speaks in jiberish, combative with basic face cleansing HEENT:  Severly sunken eye, wasted temporal area, laceration to tongue and cheek with some oozing. Panda in place Chest:   Decreased but clear anteriorly CVS: sound regular, S1, S2, don't appreciate a murmur Abdomen: soft not tender, or distended Ext: edema of all extremities, with eccymosis of right upper and other areas on arms and hands Neuro:slow to arouse , speaks in jibberish and can't follow commands, resists with minor interventions like wiping face.  Labs: CBC    Component Value Date/Time   WBC 16.4* 12/01/2013 0758   RBC 3.06* 12/01/2013 0758   HGB 9.4* 12/01/2013 0758   HCT 28.9* 12/01/2013 0758   PLT PENDING 12/01/2013 0758   MCV 94.4 12/01/2013 0758   MCH 30.7 12/01/2013 0758   MCHC 32.5 12/01/2013 0758   RDW 14.3 12/01/2013 0758   LYMPHSABS 1.4 11/28/2013 0344   MONOABS 0.7 11/28/2013 0344   EOSABS 0.0 11/28/2013 0344   BASOSABS 0.0 11/28/2013 0344      CMP     Component Value Date/Time   NA 150* 12/01/2013 0758   K 3.5* 12/01/2013 0758   CL 115* 12/01/2013 0758   CO2 22 12/01/2013 0758   GLUCOSE 228* 12/01/2013 0758   BUN 35* 12/01/2013 0758   CREATININE 1.21* 12/01/2013 0758   CALCIUM 7.7* 12/01/2013 0758   PROT 4.9* 12/01/2013 0758   ALBUMIN 1.5* 12/01/2013 0758   AST 41* 12/01/2013 0758   ALT 31 12/01/2013 0758   ALKPHOS 71 12/01/2013 0758   BILITOT <0.2* 12/01/2013 0758   GFRNONAA 39* 12/01/2013 0758   GFRAA 45* 12/01/2013 0758    Chest Xray Reviewed/Impressions:.  The support  tubes and lines appear to be in appropriate position.  2. Left lower lobe atelectasis or infiltrate is more conspicuous  today resulting in increased obscuration of the hemidiaphragm.    CT scan of the Head Reviewed/Impressions: 1. No acute intracranial process.  2. Stable atrophy with chronic microvascular ischemic disease.      Time In Time Out Total Time Spent with Patient Total Overall Time  855  am 100 am 15 min 65 min    Greater than 50%  of this time was spent counseling and coordinating care related to  the above assessment and plan.  Kyi Romanello L. Ladona Ridgel, MD MBA The Palliative Medicine Team at [facility] Phone: [phone] Pager: [phone]

## 2013-12-01 NOTE — Clinical Social Work Psychosocial (Signed)
Clinical Social Work Department BRIEF PSYCHOSOCIAL ASSESSMENT 12/01/2013  Patient:  Tiffany Mckinney,Tiffany Mckinney     Account Number:  [id number]     Admit date:  11/26/2013  Clinical Social Worker:  Lavell LusterAMPBELL,Keali Mccraw BRYANT, LCSWA  Date/Time:  12/01/2013 10:00 AM  Referred by:  Physician  Date Referred:  12/01/2013 Referred for  Residential hospice placement   Other Referral:   Interview type:  Other - See comment Other interview type:   Patient not oriented at time of assessment. Patient is ward of the state. Jethro BastosGuardian Tiffany Mckinney and guardian's supervisor interviewed.    PSYCHOSOCIAL DATA Living Status:  FACILITY Admitted [facility] Level of care:  Skilled Nursing Facility Primary support name:  Tiffany Mckinney and Tiffany Mckinney Primary support relationship to patient:  NONE Degree of support available:   Support is low. [place]    CURRENT CONCERNS Current Concerns  Post-Acute Placement   Other Concerns:    SOCIAL WORK ASSESSMENT / PLAN CSW spoke with patient's guardian Tiffany Mckinney and Network engineerMichelle's supervisor Tiffany Mckinney by phone. Tiffany Mckinney states that she wants patient to go to a residential hospice facility after having the conversation with PMT. Tiffany Mckinney states that [facility] but believes the patient will get better EOL care at a residential hospice facility. Tiffany Mckinney states that [place]. CSW explained that referrals would be made to all residential hospice facilities and patient would DC to the first available bed. Guardian verbalized understanding. CSW explained that patient could [facility] with hosp[facility] services if no beds were available. Guardian Tiffany Mckinney states that she does not want patient to return [facility].   Assessment/plan status:  Psychosocial Support/Ongoing Assessment of Needs Other assessment/ plan:   Make  residential hospice referrals.   Information/referral to community resources:   CSW contact information given to patient's guardians.    PATIENT'S/FAMILY'S RESPONSE TO PLAN OF CARE: Guardians plan to discharge patient to residential hospice facility. Guardians Tiffany Mckinney and [place] have been helpful in determining disposition for patient. CSW will assist with discharge to first available residential hospice facility.       Tiffany Mckinney, [name], [place], [phone]

## 2013-12-01 NOTE — Care Management Note (Signed)
    Page 1 of 1   12/01/2013     2:47:58 PM   CARE MANAGEMENT NOTE 12/01/2013  Patient:  Tiffany Mckinney,Tiffany Mckinney   Account Number:  [id number]  Date Initiated:  11/27/2013  Documentation initiated by:  [facility]OWN,SARAH  Subjective/Objective Assessment:   ADmitted with AMS - unresponsive from SNF.  INtubated.     Action/Plan:   Anticipated DC Date:  12/01/2013   Anticipated DC Plan:  HOSPICE MEDICAL FACILITY  In-house referral  Clinical Social Worker      DC Planning Services  CM consult      Choice offered to / List presented to:             Status of service:  Completed, signed off Medicare Important Message given?   (If response is "NO", the following Medicare IM given date fields will be blank) Date Medicare IM given:   Date Additional Medicare IM given:    Discharge Disposition:  HOSPICE MEDICAL FACILITY  Per UR Regulation:  Reviewed for med. necessity/level of care/duration of stay  If discussed at Long Length of Stay Meetings, dates discussed:    Comments:  ContactGlennon Mac:  DAUGHTERY,Tiffany Mckinney   [phone]  12/01/13 1446 Letha Capeeborah Darrall Strey RN, BSN [phone] patient for dc to [facility] if they have a bed if not patient will dc to snf with hospice following.   CSW following.  11-27-13 1pm Avie ArenasSarah Brown, RNBSN [phone] From SNF - SW referral placed.

## 2013-12-02 NOTE — Progress Notes (Addendum)
Subjective: Open eyes to verbal stimuli Gibberish speech, does not follow commands.  Objective: Filed Vitals:   12/02/13 0443  BP: 136/70  Pulse: 84  Temp: 97.4 F (36.3 C)  Resp: 20  General: Awake, unable to follow commands HEENT: Atraumatic CVS: S1-S2 clear, no murmur rubs or gallops Chest: Bilateral rhonchi Abdomen: soft nontender, nondistended, normal bowel sounds, no organomegaly Extremities: no cyanosis, there is edema and 4 extremities Neuro: Awake   Assessment and plan: Active Problems:   Acute kidney failure, unspecified   Altered mental status   Failure to thrive   Hyperglycemia   Hypernatremia   Metabolic acidosis   Elevated troponin   Hypovolemic shock   Anemia   Infection of urinary tract   Sepsis  Discharge to hospice home today.   Clint LippsMutaz A Kainen Struckman, MD [facility] Pager: [phone] 12/02/2013, 10:54 AM

## 2013-12-02 NOTE — Discharge Summary (Signed)
Physician Discharge Summary  Tiffany Mckinney:[id number] DOB: 1922-11-30 DOA: 11/26/2013  PCP: No primary provider on file.  Admit date: 11/26/2013 Discharge date: 12/02/2013  Time spent: 40 minutes  Recommendations for Outpatient Follow-up:  1. Patient is full comfort. Being discharged to a hospice home  Discharge Diagnoses:  Active Problems:   Acute kidney failure, unspecified   Altered mental status   Failure to thrive   Hyperglycemia   Hypernatremia   Metabolic acidosis   Elevated troponin   Hypovolemic shock   Anemia   Infection of urinary tract   Sepsis   Discharge Condition: Full comfort  Diet recommendation: Soft diet for comfort  Filed Weights   11/28/13 0230 11/30/13 0403 12/01/13 0409  Weight: 66.1 kg (145 lb 11.6 oz) 68.7 kg (151 lb 7.3 oz) 67.6 kg (149 lb 0.5 oz)    History of present illness:  Patient was admitted to the hospital on 3/15 with  Altered mental status requiring intubation for airway protection. She was hyperglycemic with FBS greater than 300, hypernatremia, in afib, and was hypotensive consistent with SiRS/Sepsis. Her sodium was corrected and she was treated for UTI , hypovolemic shock, possible demand ischemia with NSTEMI and sepsis. She improved enough to extubate on 3/17 but in general has remained intermittently responsive at best.    Hospital Course:   Sepsis  -Sepsis syndrome likely secondary to UTI and Pneumonia.  -On the day of admission patient started on Zosyn and vancomycin, switched to Rocephin.  -Escherichia coli UTI and staph aureus pneumonia.   Pneumonia  -Left lower lobe pneumonia, tracheal aspirate shows staph aureus and Candida.  -Pneumonia HCAP versus aspiration pneumonia.  -Started on vancomycin and Diflucan. On discharge antibiotics discontinued.   UTI  -Escherichia coli UTI, patient currently on ceftriaxone.   Hypernatremia  -Severe hypernatremia secondary to multiple etiologies including severe dehydration  and failure to thrive.  -She presented to the hospital with sodium level of 170, corrected sodium level is 173. -She is on bicarbonate drip and he acidosis resolved, I will discontinue the IV antibiotics.  Elevated troponin  -Non-ST segment elevation MI in setting of sepsis and dehydration.  -Likely type II non-STEMI secondary to demand ischemia, start aspirin and beta blockers when BP tolerates.   Metabolic acidosis  -Likely multifactorial secondary to acute renal failure and sepsis.  -Bicarbonate was 13 upon admission, currently is 22.  -This is resolved, Asian is on bicarbonate drip currently, we'll discontinue IV fluids.   Goals of care  -Patient has advanced dementia, dysphagia and likely aspiration pneumonia.  -Very poor prognosis, palliative and hospice care consulted. -After discussion with the guardian Mrs. Lynnell Dike patient is for full comfort. -[facility] hosp[facility] at home and his flow and has no available spots, patient will go back to her nursing facility with hospice/full comfort care. -Start soft diet for comfort.   Procedures:  ETT 3/16>>>3/17   R Rowena CVC 3/16>>>3/17   R Radial Arterial Line 3/17>>>3/17   Foley Catheter 3/16   Consultations:  Patient was in the PCCM till 11/29/2013.    Discharge Exam: Filed Vitals:   12/02/13 0443  BP: 136/70  Pulse: 84  Temp: 97.4 F (36.3 C)  Resp: 20   General: Alert and awake, oriented x3, not in any acute distress. HEENT: anicteric sclera, pupils reactive to light and accommodation, EOMI CVS: S1-S2 clear, no murmur rubs or gallops Chest: clear to auscultation bilaterally, no wheezing, rales or rhonchi Abdomen: soft nontender, nondistended, normal bowel sounds, no  organomegaly Extremities: no cyanosis, clubbing or edema noted bilaterally Neuro: Cranial nerves II-XII intact, no focal neurological deficits  Discharge Instructions      Discharge Orders   Future Orders Complete By Expires   Diet - low  sodium heart healthy  As directed    Increase activity slowly  As directed        Medication List    STOP taking these medications       amLODipine 10 MG tablet  Commonly known as:  NORVASC     cefTRIAXone 2 G injection  Commonly known as:  ROCEPHIN     docusate sodium 100 MG capsule  Commonly known as:  COLACE     loratadine 10 MG tablet  Commonly known as:  CLARITIN     memantine 10 MG tablet  Commonly known as:  NAMENDA      TAKE these medications       escitalopram 20 MG tablet  Commonly known as:  LEXAPRO  Take 20 mg by mouth daily.     lactose free nutrition Liqd  Take 237 mLs by mouth 3 (three) times daily with meals.     LORazepam 2 MG/ML concentrated solution  Commonly known as:  ATIVAN  Take 2.5 mLs (5 mg total) by mouth every 4 (four) hours as needed for anxiety.     metoprolol tartrate 25 MG tablet  Commonly known as:  LOPRESSOR  Take 25 mg by mouth daily.     morphine CONCENTRATE 10 mg / 0.5 ml concentrated solution  Take 0.25 mLs (5 mg total) by mouth every 2 (two) hours as needed for severe pain or shortness of breath.     multivitamin with minerals Tabs tablet  Take 1 tablet by mouth daily.     olopatadine 0.1 % ophthalmic solution  Commonly known as:  PATANOL  Place 1 drop into both eyes 2 (two) times daily.     Vitamin D (Ergocalciferol) 50000 UNITS Caps capsule  Commonly known as:  DRISDOL  Take 50,000 Units by mouth every 30 (thirty) days. Take on the 20th of each month       No Known Allergies    The results of significant diagnostics from this hospitalization (including imaging, microbiology, ancillary and laboratory) are listed below for reference.    Significant Diagnostic Studies: Dg Cervical Spine 1 View  11/27/2013   CLINICAL DATA:  Central line placement.  EXAM: DG CERVICAL SPINE - 1 VIEW  COMPARISON:  None.  FINDINGS: Right IJ catheter projects retrograde to the level of the base of the skull. Endotracheal tube noted.   IMPRESSION: Right IJ approach central venous catheter tip is at the level of the skullbase.  Critical Value/emergent results were called by telephone at the time of interpretation on 11/27/2013 at 1:52 AM to Dr. Derwood Kaplan , who verbally acknowledged these results.   Electronically Signed   By: Drusilla Kanner M.D.   On: 11/27/2013 01:52   Ct Head Wo Contrast  11/27/2013   CLINICAL DATA:  Altered mental status  EXAM: CT HEAD WITHOUT CONTRAST  TECHNIQUE: Contiguous axial images were obtained from the base of the skull through the vertex without intravenous contrast.  COMPARISON:  Prior CT from 01/30/2010  FINDINGS: Advanced age-related atrophy with chronic microvascular ischemic disease is present, similar as compared to prior CT from 01/30/2010.  There is no acute intracranial hemorrhage or infarct. No mass lesion or midline shift. Gray-white matter differentiation is well maintained. Ventricles are normal in size without  evidence of hydrocephalus. CSF containing spaces are within normal limits. No extra-axial fluid collection. Tiny remote lacunar infarct noted within the left pons.  The calvarium is intact.  Orbital soft tissues are within normal limits. Postsurgical changes noted about both globes.  Probable retention cyst noted within the right maxillary sinus. Otherwise, the paranasal sinuses and mastoid air cells are well pneumatized and free of fluid.  Scalp soft tissues are unremarkable.  IMPRESSION: 1. No acute intracranial process. 2. Stable atrophy with chronic microvascular ischemic disease.   Electronically Signed   By: Rise Mu M.D.   On: 11/27/2013 01:09   Dg Chest Port 1 View  11/28/2013   CLINICAL DATA:  Reassess endotracheal tube position  EXAM: PORTABLE CHEST - 1 VIEW  COMPARISON:  DG CHEST 1V PORT dated 11/27/2013  FINDINGS: The endotracheal tube tip lies approximately 4 cm above the crotch of the carina. The right subclavian venous catheter tip lies in the region of the  midportion of the SVC. The esophagogastric tube tip and proximal port project off the inferior margin of the film.  The right lung is adequately inflated. There is no focal infiltrate. On the left the retrocardiac region is dense consistent with minimal atelectasis. No alveolar edema or infiltrate is demonstrated.  IMPRESSION: 1. The support tubes and lines appear to be in appropriate position. 2. Left lower lobe atelectasis or infiltrate is more conspicuous today resulting in increased obscuration of the hemidiaphragm.   Electronically Signed   By: David  [place]   On: 11/28/2013 08:03   Dg Chest Port 1 View  11/27/2013   CLINICAL DATA:  Central line placement.  EXAM: PORTABLE CHEST - 1 VIEW  COMPARISON:  DG CHEST 1V PORT dated 11/27/2013  FINDINGS: Endotracheal tube noted in stable position. Previously identified central line has been redirected, its tip is projected over the superior vena cava. Persistent left base atelectasis versus infiltrate noted. Stable nodular density versus costochondral calcification right lung base. No pleural effusion or pneumothorax. Heart size is stable.  IMPRESSION: 1. Interim repositioning of central line, its tip is in the superior vena cava.  2. Left basilar atelectasis versus mild infiltrate.   Electronically Signed   By: Maisie Fus  Register   On: 11/27/2013 02:22   Dg Chest Port 1 View  11/27/2013   CLINICAL DATA:  Placement central venous catheter.  EXAM: PORTABLE CHEST - 1 VIEW  COMPARISON:  DG CHEST 1V PORT dated 11/26/2013  FINDINGS: Endotracheal tube noted with tip 4.5 cm above the carina. Central line on the right is noted looping in the supraclavicular region and coursing superiorly, most likely in the right jugular vein. Mild left basilar atelectasis present. Lungs are otherwise clear. No pneumothorax. Heart size appropriate vascularity normal. No acute bony abnormality identified.  IMPRESSION: 1. Right central line noted looping in the subclavicular region and coursing  up the distribution of the right internal jugular vein. Repositioning should be considered. This report was phoned to the patient's caregiver at the time of study. Endotracheal tube noted in good anatomic position. 2. Mild left base subsegmental atelectasis.   Electronically Signed   By: Maisie Fus  Register   On: 11/27/2013 01:54   Dg Chest Port 1 View  11/26/2013   CLINICAL DATA:  Altered mental status, respiratory distress  EXAM: PORTABLE CHEST - 1 VIEW  COMPARISON:  Prior radiograph from 10/03/2008  FINDINGS: The cardiac and mediastinal silhouettes are stable in size and contour, and remain within normal limits.  The lungs are mildly hypoinflated.  Minimal linear opacity at the left lung base most likely reflects atelectasis. No airspace consolidation, pleural effusion, or pulmonary edema is identified. There is no pneumothorax.  No acute osseous abnormality identified.  IMPRESSION: Mild left basal atelectasis. No acute cardiopulmonary abnormality identified.   Electronically Signed   By: Rise Mu M.D.   On: 11/26/2013 23:30   Dg Abd Portable 1v  11/29/2013   CLINICAL DATA:  NG tube placement.  EXAM: PORTABLE ABDOMEN - 1 VIEW  COMPARISON:  None.  FINDINGS: NG tube tip is in the body of the stomach with the tip in the proximal stomach. Visualized upper abdominal bowel gas pattern is unremarkable.  IMPRESSION: NG tube tip in the body of the stomach.   Electronically Signed   By: Charlett Nose M.D.   On: 11/29/2013 17:44    Microbiology: Recent Results (from the past 240 hour(s))  URINE CULTURE     Status: None   Collection Time    11/27/13 12:08 AM      Result Value Ref Range Status   Specimen Description URINE, CLEAN CATCH   Final   Special Requests NONE   Final   Culture  Setup Time     Final   Value: 11/27/2013 00:33     Performed at [facility] Count     Final   Value: >=100,000 COLONIES/ML     Performed at [facility]   Culture     Final   Value:  ESCHERICHIA COLI     Performed at [facility]   Report Status 11/29/2013 FINAL   Final   Organism ID, Bacteria ESCHERICHIA COLI   Final  CULTURE, BLOOD (ROUTINE X 2)     Status: None   Collection Time    11/27/13 12:43 AM      Result Value Ref Range Status   Specimen Description BLOOD RIGHT ARTERIAL   Final   Special Requests BOTTLES DRAWN AEROBIC ONLY 4CC   Final   Culture  Setup Time     Final   Value: 11/27/2013 08:25     Performed at [facility]   Culture     Final   Value:        BLOOD CULTURE RECEIVED NO GROWTH TO DATE CULTURE WILL BE HELD FOR 5 DAYS BEFORE ISSUING A FINAL NEGATIVE REPORT     Performed at [facility]   Report Status PENDING   Incomplete  CULTURE, BLOOD (ROUTINE X 2)     Status: None   Collection Time    11/27/13  1:57 AM      Result Value Ref Range Status   Specimen Description BLOOD CENTRAL LINE   Final   Special Requests     Final   Value: RIGHT Lyman BOTTLES DRAWN AEROBIC AND ANAEROBIC 5CC EA PATIENT ON FOLLOWING VANCOMYCIN,ZOSYN   Culture  Setup Time     Final   Value: 11/27/2013 08:25     Performed at [facility]   Culture     Final   Value:        BLOOD CULTURE RECEIVED NO GROWTH TO DATE CULTURE WILL BE HELD FOR 5 DAYS BEFORE ISSUING A FINAL NEGATIVE REPORT     Performed at [facility]   Report Status PENDING   Incomplete  MRSA PCR SCREENING     Status: None   Collection Time    11/27/13  3:17 AM      Result Value Ref Range  Status   MRSA by PCR NEGATIVE  NEGATIVE Final   Comment:            The GeneXpert MRSA Assay (FDA     approved for NASAL specimens     only), is one component of a     comprehensive MRSA colonization     surveillance program. It is not     intended to diagnose MRSA     infection nor to guide or     monitor treatment for     MRSA infections.  CULTURE, RESPIRATORY (NON-EXPECTORATED)     Status: None   Collection Time    11/28/13  1:25 AM      Result Value Ref Range Status    Specimen Description TRACHEAL ASPIRATE   Final   Special Requests Normal   Final   Gram Stain     Final   Value: ABUNDANT WBC PRESENT, PREDOMINANTLY PMN     RARE SQUAMOUS EPITHELIAL CELLS PRESENT     FEW YEAST     FEW GRAM POSITIVE COCCI     IN PAIRS   Culture     Final   Value: MODERATE STAPHYLOCOCCUS AUREUS     Note: RIFAMPIN AND GENTAMICIN SHOULD NOT BE USED AS SINGLE DRUGS FOR TREATMENT OF STAPH INFECTIONS.     MODERATE CANDIDA ALBICANS     [facility]   Report Status 12/01/2013 FINAL   Final   Organism ID, Bacteria STAPHYLOCOCCUS AUREUS   Final  CLOSTRIDIUM DIFFICILE BY PCR     Status: None   Collection Time    11/30/13  9:55 AM      Result Value Ref Range Status   C difficile by pcr NEGATIVE  NEGATIVE Final     Labs: Basic Metabolic Panel:  Recent Labs Lab 11/27/13 1400 11/27/13 1549 11/28/13 0344  11/28/13 1820 11/29/13 0320 11/29/13 1250 11/30/13 0745 12/01/13 0758  NA 165*  --  154*  < > 152* 152* 146 148* 150*  K 3.4*  --  2.9*  < > 4.0 3.2* 3.7 2.6* 3.5*  CL >130*  --  123*  < > 118* 118* 113* 111 115*  CO2 13*  --  12*  < > 15* 19 16* 22 22  GLUCOSE 163*  --  153*  < > 236* 114* 200* 99 228*  BUN 83*  --  65*  < > 52* 50* 51* 38* 35*  CREATININE 2.45*  --  1.99*  < > 1.60* 1.64* 1.92* 1.38* 1.21*  CALCIUM 7.7*  --  7.2*  < > 7.0* 7.3* 6.8* 7.2* 7.7*  MG  --  2.5 2.2  --  2.1 2.1  --   --  1.9  PHOS  --  3.4 2.5  --  2.1* 1.8*  --   --   --   < > = values in this interval not displayed. Liver Function Tests:  Recent Labs Lab 11/26/13 2354 11/28/13 0344 12/01/13 0758  AST 35 32 41*  ALT 34 25 31  ALKPHOS 96 90 71  BILITOT 0.6 0.3 <0.2*  PROT 7.9 5.6* 4.9*  ALBUMIN 3.3* 2.2* 1.5*   No results found for this basename: LIPASE, AMYLASE,  in the last 168 hours No results found for this basename: AMMONIA,  in the last 168 hours CBC:  Recent Labs Lab 11/26/13 2354 11/27/13 0150 11/28/13 0344 12/01/13 0758  WBC 32.4* 23.9*  34.0* 16.4*  NEUTROABS  --   --  31.9*  --  HGB 16.4* 11.3* 11.4* 9.4*  HCT 50.6* 36.3 36.1 28.9*  MCV 98.1 101.1* 98.1 94.4  PLT 204 147* 136* 100*   Cardiac Enzymes:  Recent Labs Lab 11/26/13 2356 11/27/13 0327 11/27/13 0728 11/27/13 1400  TROPONINI 0.58* 0.58* 0.48* 0.58*   BNP: BNP (last 3 results) No results found for this basename: PROBNP,  in the last 8760 hours CBG:  Recent Labs Lab 11/30/13 2059 12/01/13 0028 12/01/13 0357 12/01/13 0814 12/01/13 1200  GLUCAP 230* 198* 218* 213* 163*       Signed:  Charnette Younkin A  [facility] 12/02/2013, 9:28 AM

## 2013-12-02 NOTE — Social Work (Signed)
Patient for d/c today [facility]. RN has called report- Guardian is aware and agreeable to this plan- plan transfer via EMS. Reece LevyJanet Calistro Rauf, MSW, Theresia MajorsLCSWA [phone]

## 2013-12-02 NOTE — Progress Notes (Signed)
NURSING PROGRESS NOTE  Tiffany Mckinney [id number] Discharge Data: 12/02/2013 11:03 AM Attending Provider: Clydia LlanoMutaz Elmahi, MD PCP:No primary provider on file.   Tiffany Mckinney to be D/C'd [facility] per MD order.    All IV's will be discontinued and monitored for bleeding.  All belongings will be returned to patient for patient to take home.  To be transferred by EMS to facility.   Report given to [facility].  Last Documented Vital Signs:  Blood pressure 136/70, pulse 84, temperature 97.4 F (36.3 C), temperature source Axillary, resp. rate 20, height 5\' 5"  (1.651 m), weight 67.6 kg (149 lb 0.5 oz), SpO2 98.00%.  [name], MSN, RN, [employer]CMSRN

## 2013-12-03 LAB — CULTURE, BLOOD (ROUTINE X 2)
Culture: NO GROWTH
Culture: NO GROWTH

## 2014-01-12 DEATH — deceased

## 2015-07-16 IMAGING — CR DG CHEST 1V PORT
1 series · 1 of 1 positions shown · non-contrast
Comparison: DG CHEST 1V PORT dated 11/27/2013

CLINICAL DATA: Central line placement.

EXAM:
PORTABLE CHEST - 1 VIEW

[AP]
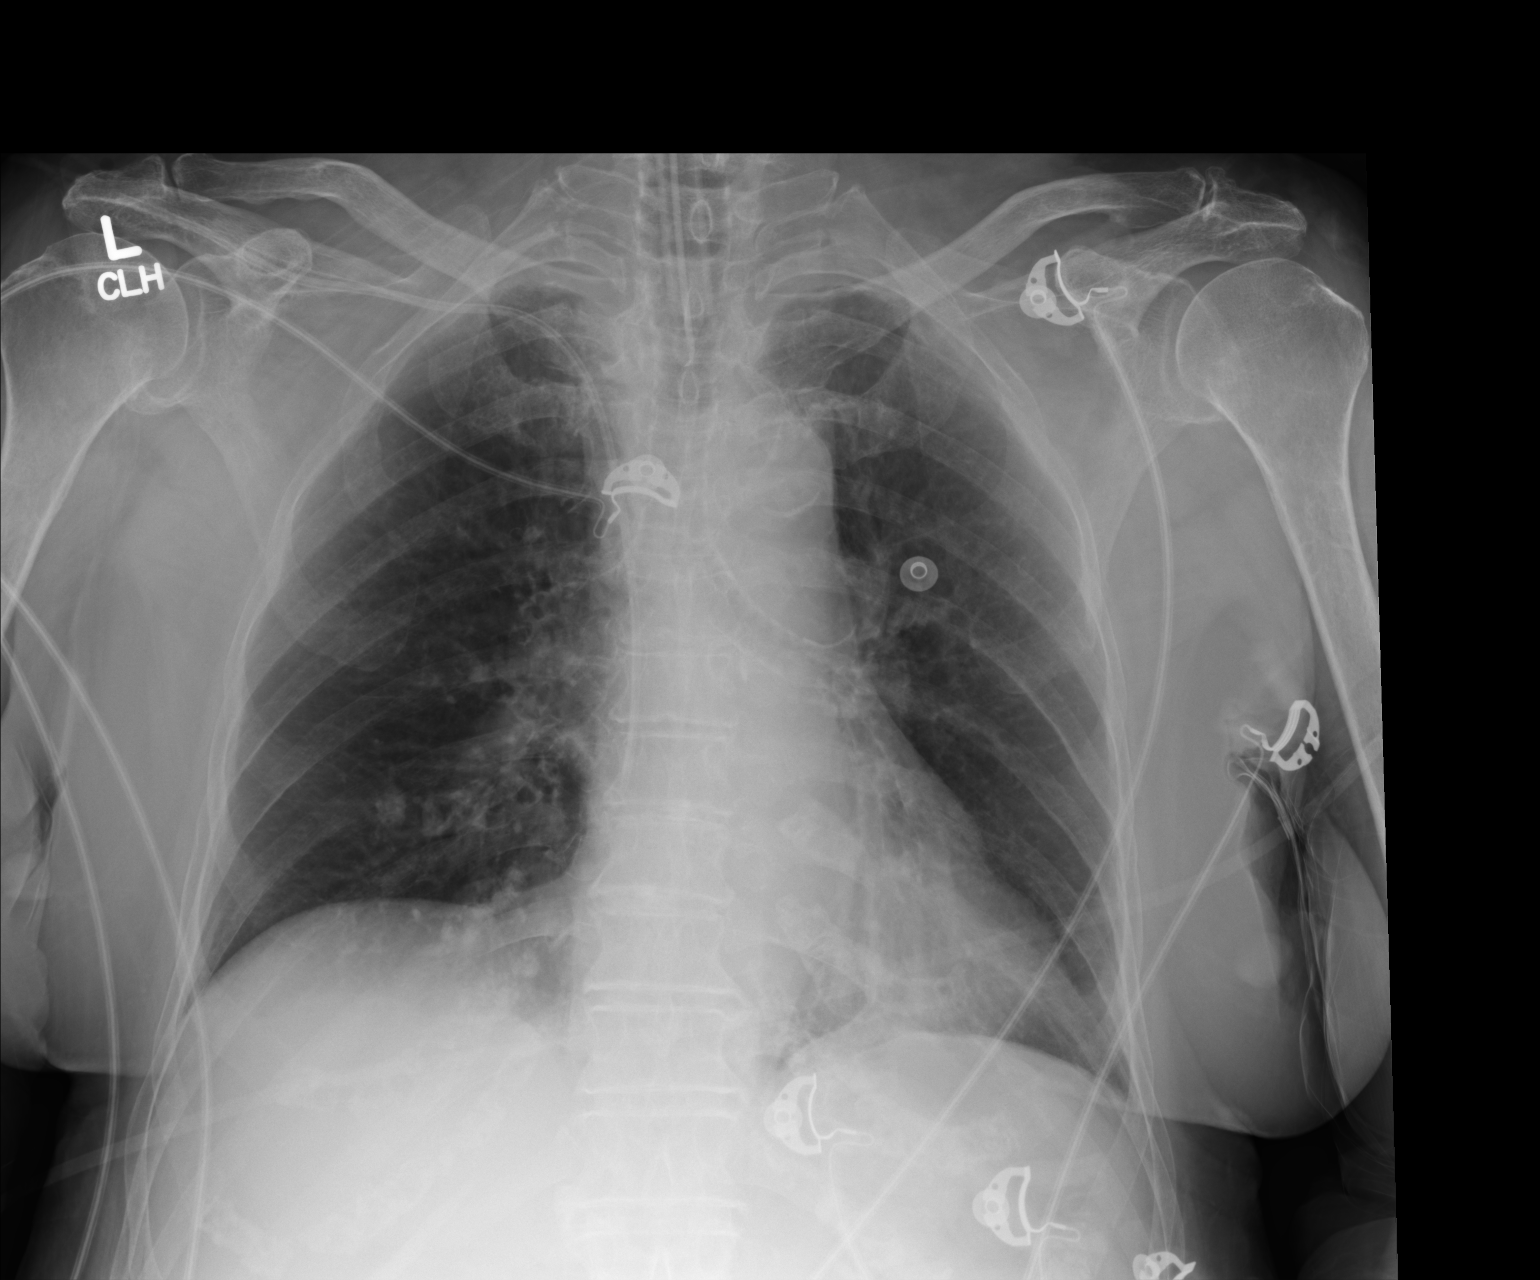

[1 of 1 positions shown; findings below may reference images not displayed]

FINDINGS: Endotracheal tube noted in stable position. Previously identified
central line has been redirected, its tip is projected over the
superior vena cava. Persistent left base atelectasis versus
infiltrate noted. Stable nodular density versus costochondral
calcification right lung base. No pleural effusion or pneumothorax.
Heart size is stable.
IMPRESSION: 1. Interim repositioning of central line, its tip is in the superior
vena cava.

2. Left basilar atelectasis versus mild infiltrate.

## 2015-07-18 IMAGING — CR DG ABD PORTABLE 1V
1 series · 1 of 1 positions shown · non-contrast
Comparison: None.

CLINICAL DATA: NG tube placement.

EXAM:
PORTABLE ABDOMEN - 1 VIEW

[AP]
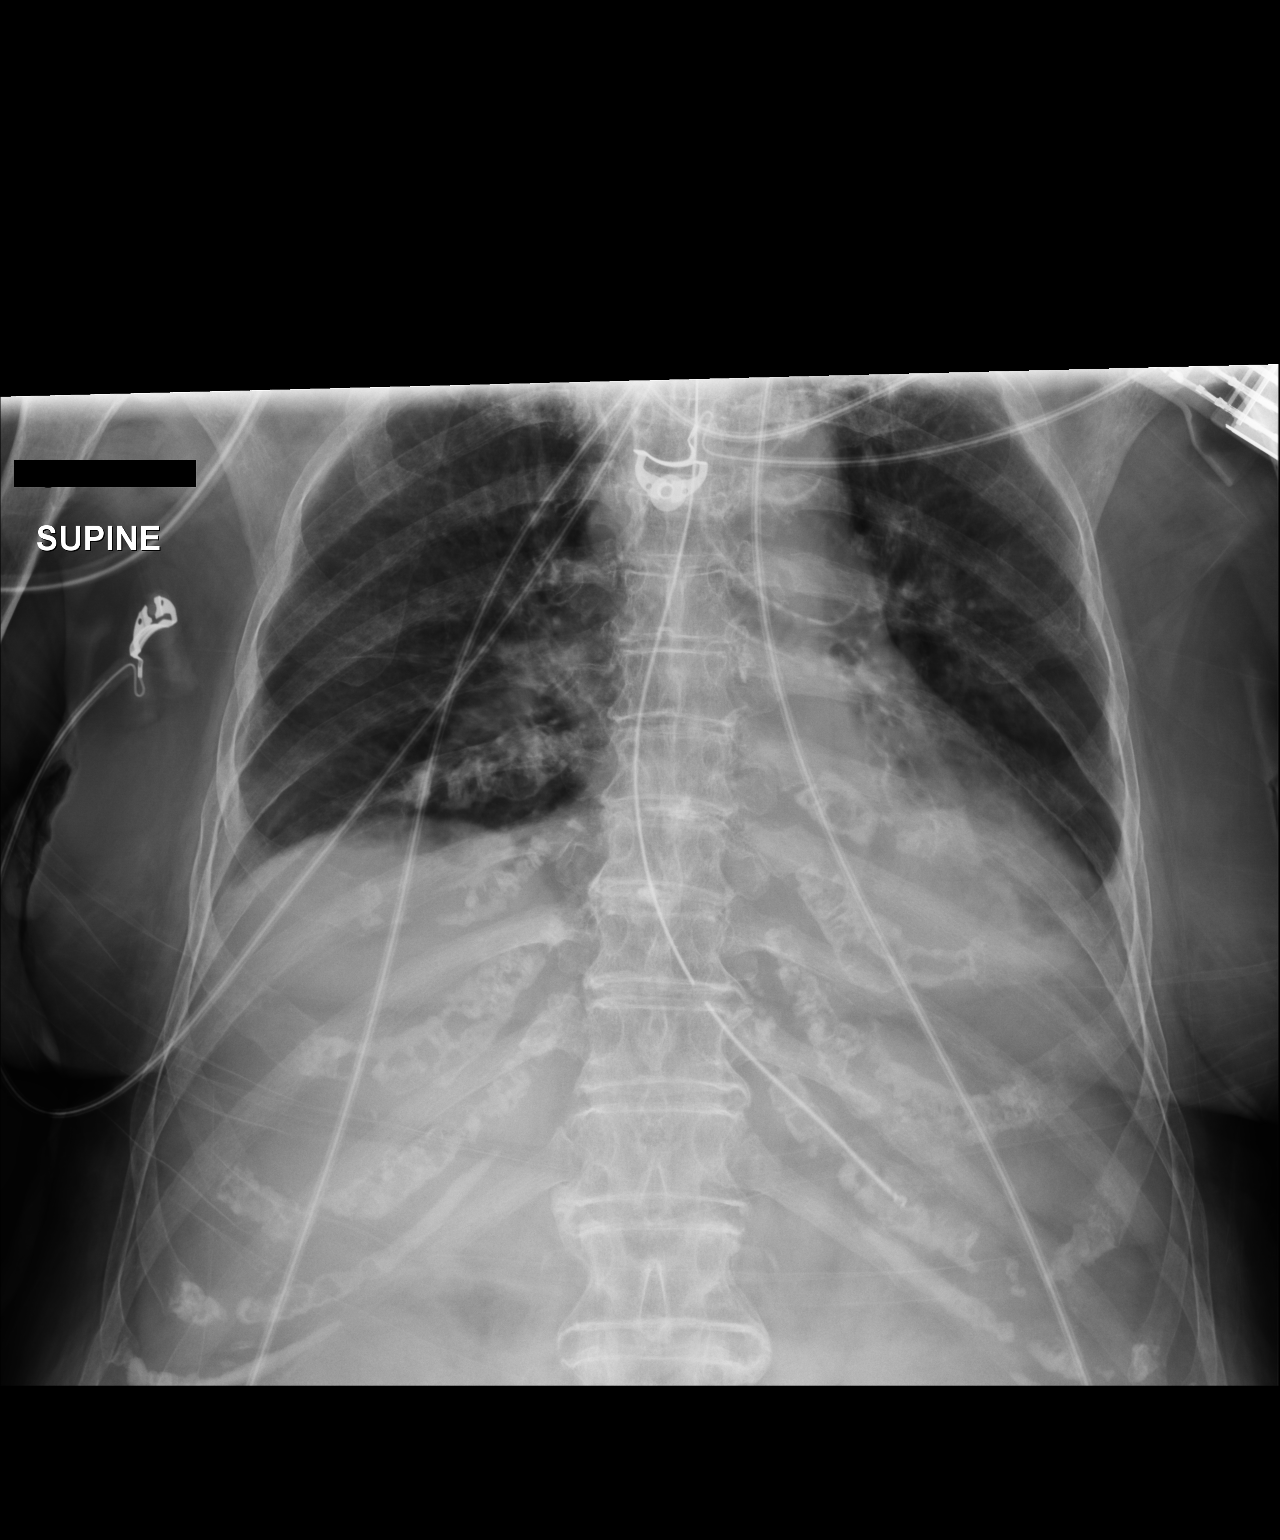

[1 of 1 positions shown; findings below may reference images not displayed]

FINDINGS: NG tube tip is in the body of the stomach with the tip in the
proximal stomach. Visualized upper abdominal bowel gas pattern is
unremarkable.
IMPRESSION: NG tube tip in the body of the stomach.
